# Patient Record
Sex: Male | Born: 1935 | Race: White | Hispanic: No | Smoking: Former smoker
Health system: Southern US, Community
[De-identification: ages and names within clinical notes are randomized; demographics above are authoritative.]

## PROBLEM LIST (undated history)

## (undated) DIAGNOSIS — I10 Essential (primary) hypertension: Secondary | ICD-10-CM

## (undated) DIAGNOSIS — M1990 Unspecified osteoarthritis, unspecified site: Secondary | ICD-10-CM

## (undated) HISTORY — PX: HIP SURGERY: SHX245

---

## 2001-11-02 ENCOUNTER — Encounter: Admission: RE | Admit: 2001-11-02 | Discharge: 2001-11-02 | Payer: Self-pay | Admitting: *Deleted

## 2001-11-02 ENCOUNTER — Encounter: Payer: Self-pay | Admitting: *Deleted

## 2006-03-25 ENCOUNTER — Ambulatory Visit: Admission: RE | Admit: 2006-03-25 | Discharge: 2006-04-06 | Payer: Self-pay | Admitting: Radiation Oncology

## 2006-04-17 ENCOUNTER — Ambulatory Visit: Admission: RE | Admit: 2006-04-17 | Discharge: 2006-07-16 | Payer: Self-pay | Admitting: Radiation Oncology

## 2006-05-26 ENCOUNTER — Ambulatory Visit (HOSPITAL_BASED_OUTPATIENT_CLINIC_OR_DEPARTMENT_OTHER): Admission: RE | Admit: 2006-05-26 | Discharge: 2006-05-26 | Payer: Self-pay | Admitting: Urology

## 2007-02-25 ENCOUNTER — Encounter: Admission: RE | Admit: 2007-02-25 | Discharge: 2007-02-25 | Payer: Self-pay | Admitting: Rheumatology

## 2007-12-29 ENCOUNTER — Encounter: Admission: RE | Admit: 2007-12-29 | Discharge: 2007-12-29 | Payer: Self-pay | Admitting: Orthopedic Surgery

## 2009-08-17 ENCOUNTER — Encounter: Admission: RE | Admit: 2009-08-17 | Discharge: 2009-08-17 | Payer: Self-pay | Admitting: Internal Medicine

## 2009-08-20 ENCOUNTER — Inpatient Hospital Stay (HOSPITAL_COMMUNITY): Admission: EM | Admit: 2009-08-20 | Discharge: 2009-08-23 | Payer: Self-pay | Admitting: Emergency Medicine

## 2009-12-25 ENCOUNTER — Encounter: Admission: RE | Admit: 2009-12-25 | Discharge: 2010-03-25 | Payer: Self-pay | Admitting: Orthopedic Surgery

## 2010-03-27 ENCOUNTER — Encounter: Admission: RE | Admit: 2010-03-27 | Discharge: 2010-05-08 | Payer: Self-pay | Admitting: Orthopedic Surgery

## 2010-10-03 LAB — URINALYSIS, ROUTINE W REFLEX MICROSCOPIC
Glucose, UA: NEGATIVE mg/dL
Hgb urine dipstick: NEGATIVE
Nitrite: NEGATIVE
Specific Gravity, Urine: 1.037 — ABNORMAL HIGH (ref 1.005–1.030)
Urobilinogen, UA: 1 mg/dL (ref 0.0–1.0)
pH: 6 (ref 5.0–8.0)

## 2010-10-03 LAB — PROTIME-INR
INR: 1.04 (ref 0.00–1.49)
Prothrombin Time: 13.5 seconds (ref 11.6–15.2)

## 2010-10-03 LAB — CBC
Hemoglobin: 15.3 g/dL (ref 13.0–17.0)
MCV: 99 fL (ref 78.0–100.0)
Platelets: 234 10*3/uL (ref 150–400)
RBC: 4.38 MIL/uL (ref 4.22–5.81)
WBC: 6.3 10*3/uL (ref 4.0–10.5)

## 2010-10-03 LAB — DIFFERENTIAL
Basophils Relative: 1 % (ref 0–1)
Eosinophils Absolute: 0.1 10*3/uL (ref 0.0–0.7)
Lymphs Abs: 1.6 10*3/uL (ref 0.7–4.0)

## 2010-10-03 LAB — BASIC METABOLIC PANEL
CO2: 25 mEq/L (ref 19–32)
Calcium: 9 mg/dL (ref 8.4–10.5)
Chloride: 107 mEq/L (ref 96–112)
Creatinine, Ser: 0.64 mg/dL (ref 0.4–1.5)
GFR calc non Af Amer: >60 mL/min (ref >60)
Potassium: 3.8 mEq/L (ref 3.5–5.1)

## 2010-10-03 LAB — URINE CULTURE

## 2010-11-30 NOTE — Op Note (Signed)
NAME:  David Key, David Key NO.:  [id number]   MEDICAL RECORD NO.:  [id number]          PATIENT TYPE:  REC   LOCATION:  [facility]                         FACILITY:  [facility]   PHYSICIAN:  Valetta Fuller, M.D.  DATE OF BIRTH:  21-Jan-1936   DATE OF PROCEDURE:  DATE OF DISCHARGE:                               OPERATIVE REPORT   PREOPERATIVE DIAGNOSIS:  Clinical stage T1c adenocarcinoma of the  prostate.   POSTOPERATIVE DIAGNOSIS:  Clinical stage T1c adenocarcinoma of the  prostate.   PROCEDURE PERFORMED:  I-125 prostate seed implantation.   SURGEON:  Valetta Fuller, M.D.   ASSISTANT:  Dayton Scrape.   ANESTHESIA:  General.   INDICATIONS:  [name] is a 75 year old male who was recently diagnosed  with clinical stage T1c adenocarcinoma of the prostate.  Ultrasound  revealed about 40 gram prostate.  The patient was found to have  bilateral adenocarcinoma of the prostate with a Gleason score of 3 + 3 =  6 on the left and 3 + 4 = 7 on the right.  Approximately 10% of the  tissue was involved bilaterally.  The patient's pretreatment PSA was  around 5.2.  The patient underwent extensive consultation with regard to  treatment options and also saw Dr. Ballard Russell for second opinion.  He  elected to proceed with the radiation approach and elected I-125 seed  implantation is a monotherapy.  He appeared to understand the  advantages, disadvantages, potential complications of that.  He presents  now for the procedure.   TECHNIQUE AND FINDINGS:  The patient was brought to the operating room  where he had successful induction of general anesthesia.  He was placed  in the mid lithotomy position and prepped and draped in the usual  manner.  Transrectal ultrasound probe was inserted and anchored to a  stand.  Anchoring needles were placed.  Foley catheter was also inserted  sterilely with contrast in the balloon.  The radiation oncology  department then real-time contouring of the prostate,  urethra and  rectum.  Dosing plan was established and then tweaked.  Total dose  delivered was 145 Gray.  This was done with a Nucletron seed implanting  device.  All needle passages were done with real-time ultrasound in the  sagittal plane.  A total of 27 needles were passed.  The total planned  number of seeds was 59.  This all occurred without difficulty.  At the  end of the procedure, the distribution of seeds looked excellent.  Flexible cystoscopy, however, did show two lose seeds within the  bladder.  One was removed and the other one was unable  to be removed, and we felt that the patient would be able to void that  out without difficulty.  A new Foley catheter was placed..  The patient  was brought to recovery room in stable condition, had no obvious  complications or problems.           ______________________________  Valetta Fuller, M.D.  Electronically Signed     DSG/MEDQ  D:  05/26/2006  T:  05/26/2006  Job:  [id number]   cc:   Demetria Pore. Coral Spikes, M.D.  Fax: [phone]

## 2011-02-15 ENCOUNTER — Emergency Department (HOSPITAL_BASED_OUTPATIENT_CLINIC_OR_DEPARTMENT_OTHER): Payer: Medicare Other

## 2011-02-15 ENCOUNTER — Emergency Department (INDEPENDENT_AMBULATORY_CARE_PROVIDER_SITE_OTHER): Payer: Medicare Other

## 2011-02-15 ENCOUNTER — Emergency Department (HOSPITAL_BASED_OUTPATIENT_CLINIC_OR_DEPARTMENT_OTHER)
Admission: EM | Admit: 2011-02-15 | Discharge: 2011-02-15 | Disposition: A | Discharge status: Home / Self Care | Payer: Medicare Other | Attending: Emergency Medicine | Admitting: Emergency Medicine

## 2011-02-15 ENCOUNTER — Emergency Department (HOSPITAL_COMMUNITY): Payer: Medicare Other

## 2011-02-15 ENCOUNTER — Encounter: Payer: Self-pay | Admitting: *Deleted

## 2011-02-15 DIAGNOSIS — S2239XA Fracture of one rib, unspecified side, initial encounter for closed fracture: Secondary | ICD-10-CM | POA: Insufficient documentation

## 2011-02-15 DIAGNOSIS — R079 Chest pain, unspecified: Secondary | ICD-10-CM

## 2011-02-15 DIAGNOSIS — S2249XA Multiple fractures of ribs, unspecified side, initial encounter for closed fracture: Secondary | ICD-10-CM

## 2011-02-15 DIAGNOSIS — W010XXA Fall on same level from slipping, tripping and stumbling without subsequent striking against object, initial encounter: Secondary | ICD-10-CM | POA: Insufficient documentation

## 2011-02-15 DIAGNOSIS — W19XXXA Unspecified fall, initial encounter: Secondary | ICD-10-CM

## 2011-02-15 DIAGNOSIS — I10 Essential (primary) hypertension: Secondary | ICD-10-CM | POA: Insufficient documentation

## 2011-02-15 DIAGNOSIS — K8020 Calculus of gallbladder without cholecystitis without obstruction: Secondary | ICD-10-CM

## 2011-02-15 DIAGNOSIS — M549 Dorsalgia, unspecified: Secondary | ICD-10-CM | POA: Insufficient documentation

## 2011-02-15 DIAGNOSIS — Z8739 Personal history of other diseases of the musculoskeletal system and connective tissue: Secondary | ICD-10-CM | POA: Insufficient documentation

## 2011-02-15 DIAGNOSIS — Y9210 Unspecified residential institution as the place of occurrence of the external cause: Secondary | ICD-10-CM | POA: Insufficient documentation

## 2011-02-15 DIAGNOSIS — R109 Unspecified abdominal pain: Secondary | ICD-10-CM | POA: Insufficient documentation

## 2011-02-15 HISTORY — DX: Essential (primary) hypertension: I10

## 2011-02-15 HISTORY — DX: Unspecified osteoarthritis, unspecified site: M19.90

## 2011-02-15 LAB — DIFFERENTIAL
Basophils Absolute: 0 10*3/uL (ref 0.0–0.1)
Basophils Relative: 0 % (ref 0–1)
Eosinophils Absolute: 0 10*3/uL (ref 0.0–0.7)
Eosinophils Relative: 0 % (ref 0–5)
Lymphocytes Relative: 18 % (ref 12–46)
Monocytes Absolute: 1.1 10*3/uL — ABNORMAL HIGH (ref 0.1–1.0)

## 2011-02-15 LAB — BASIC METABOLIC PANEL
CO2: 26 mEq/L (ref 19–32)
Chloride: 95 mEq/L — ABNORMAL LOW (ref 96–112)
GFR calc Af Amer: >60 mL/min (ref >60)
GFR calc non Af Amer: >60 mL/min (ref >60)

## 2011-02-15 LAB — CBC
HCT: 41.6 % (ref 39.0–52.0)
Hemoglobin: 14.4 g/dL (ref 13.0–17.0)
MCHC: 34.6 g/dL (ref 30.0–36.0)
Platelets: 178 10*3/uL (ref 150–400)

## 2011-02-15 MED ORDER — SODIUM CHLORIDE 0.9 % IV SOLN
Freq: Once | INTRAVENOUS | Status: AC
  Administered 2011-02-15: 16:00:00 via INTRAVENOUS

## 2011-02-15 MED ORDER — OXYCODONE-ACETAMINOPHEN 5-325 MG PO TABS: 2.0000 | ORAL_TABLET | ORAL | Status: AC | PRN

## 2011-02-15 MED ORDER — IOHEXOL 300 MG/ML  SOLN
100.0000 mL | Freq: Once | INTRAMUSCULAR | Status: AC | PRN
  Administered 2011-02-15: 100 mL via INTRAVENOUS

## 2011-02-15 NOTE — ED Notes (Signed)
Pt. Is in no distress.  He is reporting pain in the L back rib area after a fall on Wed.  NO other complaints by pt.  Pt. Reports to RN he lives alone with no needs and uses his walker due to a broken R leg in the past 2 yrs.

## 2011-02-15 NOTE — ED Notes (Signed)
Pt. Being trained on  Incentive spirometer

## 2011-02-15 NOTE — ED Notes (Signed)
Tripped and fell a few days ago. Inj to his left ribs. Today pain goes into his back and causes him to have a hard time getting a deep breath.

## 2011-02-15 NOTE — ED Provider Notes (Signed)
History     CSN: [id number] Arrival date & time: 02/15/2011  2:27 PM  Chief Complaint  Patient presents with  . Fall   Patient is a 75 y.o. male presenting with fall. The history is provided by the patient.  Fall The accident occurred more than 2 days ago. The fall occurred while walking. He fell from a height of 1 to 2 ft. He landed on carpet. The pain is at a severity of 4/10. The pain is moderate. He was not ambulatory at the scene. There was no drug use involved in the accident. Associated symptoms include abdominal pain. Pertinent negatives include no visual change. The symptoms are aggravated by activity and ambulation. He has tried nothing for the symptoms.  Pt reports he slipped and fell hitting his left ribs and his chest.  Pt reports pain in left abdomen and back.   Past Medical History  Diagnosis Date  . Hypertension   . Arthritis     Past Surgical History  Procedure Date  . Hip surgery     No family history on file.  History  Substance Use Topics  . Smoking status: Former Games developer  . Smokeless tobacco: Not on file  . Alcohol Use: Yes     daily      Review of Systems  Gastrointestinal: Positive for abdominal pain.  Musculoskeletal: Positive for myalgias and back pain.  All other systems reviewed and are negative.    Physical Exam  BP 102/70  Pulse 124  Temp(Src) 99 F (37.2 C) (Oral)  Resp 24  SpO2 100%  Physical Exam  Constitutional: He appears well-developed and well-nourished.  HENT:  Head: Normocephalic.  Eyes: Pupils are equal, round, and reactive to light.  Neck: Normal range of motion. Neck supple.  Cardiovascular: Normal rate.   Pulmonary/Chest: Effort normal.  Abdominal: Soft. There is tenderness. There is guarding.  Musculoskeletal: Normal range of motion.  Neurological: He is alert. No cranial nerve deficit. Coordination normal.  Skin: Skin is warm and dry.    ED Course  Procedures  MDM  patient is very tender in the left upper  abdomen and the left posterior thoracic area. Dr. Dierdre Highman was in to see and examine the patient. CT scan of chest and abdomen was obtained which shows left sixth, seventh and eighth rib fracture patient is counseled on results prescription for pain medicine patient advised to see his physician for recheck on Monday to return to the emergency department if any fever cough or difficulty breathing. Results for orders placed during the hospital encounter of 02/15/11  CBC      Component Value Range   WBC 7.4  4.0 - 10.5 (K/uL)   RBC 4.36  4.22 - 5.81 (MIL/uL)   Hemoglobin 14.4  13.0 - 17.0 (g/dL)   HCT 78.2  95.6 - 21.3 (%)   MCV 95.4  78.0 - 100.0 (fL)   MCH 33.0  26.0 - 34.0 (pg)   MCHC 34.6  30.0 - 36.0 (g/dL)   RDW 08.6  57.8 - 46.9 (%)   Platelets 178  150 - 400 (K/uL)  DIFFERENTIAL      Component Value Range   Neutrophils Relative 68  43 - 77 (%)   Neutro Abs 5.0  1.7 - 7.7 (K/uL)   Lymphocytes Relative 18  12 - 46 (%)   Lymphs Abs 1.3  0.7 - 4.0 (K/uL)   Monocytes Relative 14 (*) 3 - 12 (%)   Monocytes Absolute 1.1 (*) 0.1 - 1.0 (  K/uL)   Eosinophils Relative 0  0 - 5 (%)   Eosinophils Absolute 0.0  0.0 - 0.7 (K/uL)   Basophils Relative 0  0 - 1 (%)   Basophils Absolute 0.0  0.0 - 0.1 (K/uL)  BASIC METABOLIC PANEL      Component Value Range   Sodium 134 (*) 135 - 145 (mEq/L)   Potassium 4.7  3.5 - 5.1 (mEq/L)   Chloride 95 (*) 96 - 112 (mEq/L)   CO2 26  19 - 32 (mEq/L)   Glucose, Bld 102 (*) 70 - 99 (mg/dL)   BUN 13  6 - 23 (mg/dL)   Creatinine, Ser 1.61  0.50 - 1.35 (mg/dL)   Calcium 9.8  8.4 - 09.6 (mg/dL)   GFR calc non Af Amer >60  >60 (mL/min)   GFR calc Af Amer >60  >60 (mL/min)   Dg Chest 2 View  02/15/2011  *RADIOLOGY REPORT*  Clinical Data: Pain radiating to the left back area after fall on Wednesday  CHEST - 2 VIEW  Comparison: 08/20/2009; AP pelvis - 08/20/2009  Findings: Unchanged cardiac silhouette and mediastinal contours. No focal parenchymal opacities.  Persistent  blunting of the left costophrenic angle.  No definite pleural effusion or pneumothorax. No displaced rib fractures.  There apparent flowing syndesmophytes of the visualized thoracic spine worrisome for ankylosing spondylitis.  IMPRESSION: 1. No acute cardiopulmonary disease.  Specifically, no displaced rib fractures, definite pleural effusion or pneumothorax.  2.  Radiographic findings worrisome for ankylosing spondylitis.  If patient has any back pain, a CT scan of the area of interest is recommended.  Above findings discussed with Langston Masker, PA, at [phone]  Original Report Authenticated By: Waynard Reeds, M.D.   Ct Chest W Contrast  02/15/2011  *RADIOLOGY REPORT*  Clinical Data:  Fall, left back and rib pain.  CT CHEST, ABDOMEN AND PELVIS WITH CONTRAST  Technique:  Multidetector CT imaging of the chest, abdomen and pelvis was performed following the standard protocol during bolus administration of intravenous contrast.  Contrast: 100 ml of Omnipaque-300  Comparison:  None.  CT CHEST  Findings:  Atelectasis is seen at the lung bases.  No pleural effusion or pneumothorax is present.  The thyroid is normal.  The heart is normal in size without pericardial effusion.  The great vessels are intact.  Atherosclerotic calcifications are seen in the coronary arteries.  No mediastinal adenopathy is present. Displaced left 6th or 8th rib fractures are seen anterolaterally.  No other fractures are seen.  Flowing osteophytes are seen in the spine.  IMPRESSION: Minimally displaced left anterolateral sixth through eighth rib fractures. No pneumothorax or pleural effusion.  CT ABDOMEN AND PELVIS  Findings:  Diffuse hepatic steatosis is identified.  Gallstones are identified in the gallbladder lumen.  The adrenal glands, spleen and pancreas are normal.  The kidneys enhance symmetrically.  No focal mass lesion is seen.  No small bowel obstruction is identified. A moderate volume of stool seen throughout the colon. There is  colonic diverticulosis without acute diverticulitis. Evaluation of the pelvis is limited secondary to streak artifact from bilateral hip replacements. There is ectasia of the infrarenal abdominal aorta measuring a maximum of 2.7 cm.  No lymphadenopathy is present in the abdomen.  There is no definite free fluid seen. Radiation seeds are seen in the prostate.  Focal area of sclerosis is seen in the adjacent the right acetabulum, of uncertain etiology although, this is unchanged compared to plain film dated 08/20/2009.  Bilateral  total hip replacements are seen.  There are flowing osteophytes noted throughout the spine.  IMPRESSION:  1.  No acute findings in the abdomen or pelvis. 2.  Cholelithiasis without acute cholecystitis. 3.  Limited evaluation in the pelvis secondary to bilateral total hip prostheses. Unchanged nonspecific area of sclerosis adjacent to the right acetabulum detailed above.  Original Report Authenticated By: Hiram Gash, M.D.   Ct Abdomen Pelvis W Contrast  02/15/2011  *RADIOLOGY REPORT*  Clinical Data:  Fall, left back and rib pain.  CT CHEST, ABDOMEN AND PELVIS WITH CONTRAST  Technique:  Multidetector CT imaging of the chest, abdomen and pelvis was performed following the standard protocol during bolus administration of intravenous contrast.  Contrast: 100 ml of Omnipaque-300  Comparison:  None.  CT CHEST  Findings:  Atelectasis is seen at the lung bases.  No pleural effusion or pneumothorax is present.  The thyroid is normal.  The heart is normal in size without pericardial effusion.  The great vessels are intact.  Atherosclerotic calcifications are seen in the coronary arteries.  No mediastinal adenopathy is present. Displaced left 6th or 8th rib fractures are seen anterolaterally.  No other fractures are seen.  Flowing osteophytes are seen in the spine.  IMPRESSION: Minimally displaced left anterolateral sixth through eighth rib fractures. No pneumothorax or pleural effusion.  CT  ABDOMEN AND PELVIS  Findings:  Diffuse hepatic steatosis is identified.  Gallstones are identified in the gallbladder lumen.  The adrenal glands, spleen and pancreas are normal.  The kidneys enhance symmetrically.  No focal mass lesion is seen.  No small bowel obstruction is identified. A moderate volume of stool seen throughout the colon. There is colonic diverticulosis without acute diverticulitis. Evaluation of the pelvis is limited secondary to streak artifact from bilateral hip replacements. There is ectasia of the infrarenal abdominal aorta measuring a maximum of 2.7 cm.  No lymphadenopathy is present in the abdomen.  There is no definite free fluid seen. Radiation seeds are seen in the prostate.  Focal area of sclerosis is seen in the adjacent the right acetabulum, of uncertain etiology although, this is unchanged compared to plain film dated 08/20/2009.  Bilateral total hip replacements are seen.  There are flowing osteophytes noted throughout the spine.  IMPRESSION:  1.  No acute findings in the abdomen or pelvis. 2.  Cholelithiasis without acute cholecystitis. 3.  Limited evaluation in the pelvis secondary to bilateral total hip prostheses. Unchanged nonspecific area of sclerosis adjacent to the right acetabulum detailed above.  Original Report Authenticated By: Hiram Gash, M.D.        Langston Masker, [place] 02/15/11 1807  Medical screening examination/treatment/procedure(s) were conducted as a shared visit with non-physician practitioner(s) and myself.  I personally evaluated the patient during the encounter   Sunnie Nielsen, MD 02/16/11 [phone]

## 2011-08-20 DIAGNOSIS — C61 Malignant neoplasm of prostate: Secondary | ICD-10-CM | POA: Diagnosis not present

## 2011-08-27 DIAGNOSIS — C61 Malignant neoplasm of prostate: Secondary | ICD-10-CM | POA: Diagnosis not present

## 2011-09-04 DIAGNOSIS — I10 Essential (primary) hypertension: Secondary | ICD-10-CM | POA: Diagnosis not present

## 2011-09-04 DIAGNOSIS — M459 Ankylosing spondylitis of unspecified sites in spine: Secondary | ICD-10-CM | POA: Diagnosis not present

## 2011-09-04 DIAGNOSIS — M1990 Unspecified osteoarthritis, unspecified site: Secondary | ICD-10-CM | POA: Diagnosis not present

## 2011-09-04 DIAGNOSIS — E785 Hyperlipidemia, unspecified: Secondary | ICD-10-CM | POA: Diagnosis not present

## 2011-09-04 DIAGNOSIS — C61 Malignant neoplasm of prostate: Secondary | ICD-10-CM | POA: Diagnosis not present

## 2011-09-04 DIAGNOSIS — D126 Benign neoplasm of colon, unspecified: Secondary | ICD-10-CM | POA: Diagnosis not present

## 2011-09-04 DIAGNOSIS — R7309 Other abnormal glucose: Secondary | ICD-10-CM | POA: Diagnosis not present

## 2011-09-04 DIAGNOSIS — Z1331 Encounter for screening for depression: Secondary | ICD-10-CM | POA: Diagnosis not present

## 2011-10-21 DIAGNOSIS — D129 Benign neoplasm of anus and anal canal: Secondary | ICD-10-CM | POA: Diagnosis not present

## 2011-10-21 DIAGNOSIS — D128 Benign neoplasm of rectum: Secondary | ICD-10-CM | POA: Diagnosis not present

## 2011-10-21 DIAGNOSIS — D126 Benign neoplasm of colon, unspecified: Secondary | ICD-10-CM | POA: Diagnosis not present

## 2011-10-21 DIAGNOSIS — Z09 Encounter for follow-up examination after completed treatment for conditions other than malignant neoplasm: Secondary | ICD-10-CM | POA: Diagnosis not present

## 2011-10-21 DIAGNOSIS — K5730 Diverticulosis of large intestine without perforation or abscess without bleeding: Secondary | ICD-10-CM | POA: Diagnosis not present

## 2011-10-21 DIAGNOSIS — Z86010 Personal history of colonic polyps: Secondary | ICD-10-CM | POA: Diagnosis not present

## 2011-10-30 DIAGNOSIS — H25019 Cortical age-related cataract, unspecified eye: Secondary | ICD-10-CM | POA: Diagnosis not present

## 2011-10-30 DIAGNOSIS — H52209 Unspecified astigmatism, unspecified eye: Secondary | ICD-10-CM | POA: Diagnosis not present

## 2011-10-30 DIAGNOSIS — H353190 Nonexudative age-related macular degeneration, unspecified eye, stage unspecified: Secondary | ICD-10-CM | POA: Diagnosis not present

## 2011-10-30 DIAGNOSIS — H524 Presbyopia: Secondary | ICD-10-CM | POA: Diagnosis not present

## 2011-10-30 DIAGNOSIS — H5200 Hypermetropia, unspecified eye: Secondary | ICD-10-CM | POA: Diagnosis not present

## 2011-10-30 DIAGNOSIS — H2510 Age-related nuclear cataract, unspecified eye: Secondary | ICD-10-CM | POA: Diagnosis not present

## 2011-11-13 DIAGNOSIS — L259 Unspecified contact dermatitis, unspecified cause: Secondary | ICD-10-CM | POA: Diagnosis not present

## 2011-12-19 DIAGNOSIS — H353190 Nonexudative age-related macular degeneration, unspecified eye, stage unspecified: Secondary | ICD-10-CM | POA: Diagnosis not present

## 2011-12-19 DIAGNOSIS — H25019 Cortical age-related cataract, unspecified eye: Secondary | ICD-10-CM | POA: Diagnosis not present

## 2011-12-19 DIAGNOSIS — H2510 Age-related nuclear cataract, unspecified eye: Secondary | ICD-10-CM | POA: Diagnosis not present

## 2011-12-19 DIAGNOSIS — H3530 Unspecified macular degeneration: Secondary | ICD-10-CM | POA: Diagnosis not present

## 2012-03-03 DIAGNOSIS — M459 Ankylosing spondylitis of unspecified sites in spine: Secondary | ICD-10-CM | POA: Diagnosis not present

## 2012-03-03 DIAGNOSIS — E669 Obesity, unspecified: Secondary | ICD-10-CM | POA: Diagnosis not present

## 2012-03-03 DIAGNOSIS — B372 Candidiasis of skin and nail: Secondary | ICD-10-CM | POA: Diagnosis not present

## 2012-03-03 DIAGNOSIS — I10 Essential (primary) hypertension: Secondary | ICD-10-CM | POA: Diagnosis not present

## 2012-04-11 DIAGNOSIS — Z23 Encounter for immunization: Secondary | ICD-10-CM | POA: Diagnosis not present

## 2012-04-22 DIAGNOSIS — I8310 Varicose veins of unspecified lower extremity with inflammation: Secondary | ICD-10-CM | POA: Diagnosis not present

## 2012-04-22 DIAGNOSIS — L820 Inflamed seborrheic keratosis: Secondary | ICD-10-CM | POA: Diagnosis not present

## 2012-06-25 DIAGNOSIS — H3530 Unspecified macular degeneration: Secondary | ICD-10-CM | POA: Diagnosis not present

## 2012-06-25 DIAGNOSIS — H2510 Age-related nuclear cataract, unspecified eye: Secondary | ICD-10-CM | POA: Diagnosis not present

## 2012-06-25 DIAGNOSIS — H353190 Nonexudative age-related macular degeneration, unspecified eye, stage unspecified: Secondary | ICD-10-CM | POA: Diagnosis not present

## 2012-06-25 DIAGNOSIS — H25019 Cortical age-related cataract, unspecified eye: Secondary | ICD-10-CM | POA: Diagnosis not present

## 2012-08-25 DIAGNOSIS — I872 Venous insufficiency (chronic) (peripheral): Secondary | ICD-10-CM | POA: Diagnosis not present

## 2012-08-25 DIAGNOSIS — I8310 Varicose veins of unspecified lower extremity with inflammation: Secondary | ICD-10-CM | POA: Diagnosis not present

## 2012-08-25 DIAGNOSIS — B079 Viral wart, unspecified: Secondary | ICD-10-CM | POA: Diagnosis not present

## 2012-09-03 DIAGNOSIS — I10 Essential (primary) hypertension: Secondary | ICD-10-CM | POA: Diagnosis not present

## 2012-09-03 DIAGNOSIS — R609 Edema, unspecified: Secondary | ICD-10-CM | POA: Diagnosis not present

## 2012-09-03 DIAGNOSIS — Z1331 Encounter for screening for depression: Secondary | ICD-10-CM | POA: Diagnosis not present

## 2012-09-03 DIAGNOSIS — M459 Ankylosing spondylitis of unspecified sites in spine: Secondary | ICD-10-CM | POA: Diagnosis not present

## 2012-09-03 DIAGNOSIS — Z0000 Encounter for general adult medical examination without abnormal findings: Secondary | ICD-10-CM | POA: Diagnosis not present

## 2012-09-03 DIAGNOSIS — C61 Malignant neoplasm of prostate: Secondary | ICD-10-CM | POA: Diagnosis not present

## 2012-09-03 DIAGNOSIS — R7309 Other abnormal glucose: Secondary | ICD-10-CM | POA: Diagnosis not present

## 2012-09-03 DIAGNOSIS — E782 Mixed hyperlipidemia: Secondary | ICD-10-CM | POA: Diagnosis not present

## 2012-11-04 DIAGNOSIS — L259 Unspecified contact dermatitis, unspecified cause: Secondary | ICD-10-CM | POA: Diagnosis not present

## 2012-11-04 DIAGNOSIS — I8310 Varicose veins of unspecified lower extremity with inflammation: Secondary | ICD-10-CM | POA: Diagnosis not present

## 2012-11-05 DIAGNOSIS — C61 Malignant neoplasm of prostate: Secondary | ICD-10-CM | POA: Diagnosis not present

## 2012-11-05 DIAGNOSIS — N529 Male erectile dysfunction, unspecified: Secondary | ICD-10-CM | POA: Diagnosis not present

## 2012-12-24 DIAGNOSIS — H353190 Nonexudative age-related macular degeneration, unspecified eye, stage unspecified: Secondary | ICD-10-CM | POA: Diagnosis not present

## 2012-12-24 DIAGNOSIS — H2510 Age-related nuclear cataract, unspecified eye: Secondary | ICD-10-CM | POA: Diagnosis not present

## 2012-12-24 DIAGNOSIS — H25019 Cortical age-related cataract, unspecified eye: Secondary | ICD-10-CM | POA: Diagnosis not present

## 2012-12-24 DIAGNOSIS — H3530 Unspecified macular degeneration: Secondary | ICD-10-CM | POA: Diagnosis not present

## 2012-12-30 DIAGNOSIS — L820 Inflamed seborrheic keratosis: Secondary | ICD-10-CM | POA: Diagnosis not present

## 2012-12-30 DIAGNOSIS — I8310 Varicose veins of unspecified lower extremity with inflammation: Secondary | ICD-10-CM | POA: Diagnosis not present

## 2013-03-03 DIAGNOSIS — C61 Malignant neoplasm of prostate: Secondary | ICD-10-CM | POA: Diagnosis not present

## 2013-03-03 DIAGNOSIS — K6289 Other specified diseases of anus and rectum: Secondary | ICD-10-CM | POA: Diagnosis not present

## 2013-03-03 DIAGNOSIS — I10 Essential (primary) hypertension: Secondary | ICD-10-CM | POA: Diagnosis not present

## 2013-03-12 DIAGNOSIS — I8310 Varicose veins of unspecified lower extremity with inflammation: Secondary | ICD-10-CM | POA: Diagnosis not present

## 2013-04-23 DIAGNOSIS — D692 Other nonthrombocytopenic purpura: Secondary | ICD-10-CM | POA: Diagnosis not present

## 2013-04-23 DIAGNOSIS — I8310 Varicose veins of unspecified lower extremity with inflammation: Secondary | ICD-10-CM | POA: Diagnosis not present

## 2013-05-14 DIAGNOSIS — Z23 Encounter for immunization: Secondary | ICD-10-CM | POA: Diagnosis not present

## 2013-06-24 DIAGNOSIS — H2510 Age-related nuclear cataract, unspecified eye: Secondary | ICD-10-CM | POA: Diagnosis not present

## 2013-06-24 DIAGNOSIS — H524 Presbyopia: Secondary | ICD-10-CM | POA: Diagnosis not present

## 2013-06-24 DIAGNOSIS — H25019 Cortical age-related cataract, unspecified eye: Secondary | ICD-10-CM | POA: Diagnosis not present

## 2013-06-24 DIAGNOSIS — H353190 Nonexudative age-related macular degeneration, unspecified eye, stage unspecified: Secondary | ICD-10-CM | POA: Diagnosis not present

## 2013-07-09 DIAGNOSIS — H2510 Age-related nuclear cataract, unspecified eye: Secondary | ICD-10-CM | POA: Diagnosis not present

## 2013-07-21 DIAGNOSIS — H3530 Unspecified macular degeneration: Secondary | ICD-10-CM | POA: Diagnosis not present

## 2013-07-21 DIAGNOSIS — Z96649 Presence of unspecified artificial hip joint: Secondary | ICD-10-CM | POA: Diagnosis not present

## 2013-07-21 DIAGNOSIS — H2510 Age-related nuclear cataract, unspecified eye: Secondary | ICD-10-CM | POA: Diagnosis not present

## 2013-07-21 DIAGNOSIS — Z981 Arthrodesis status: Secondary | ICD-10-CM | POA: Diagnosis not present

## 2013-07-21 DIAGNOSIS — H269 Unspecified cataract: Secondary | ICD-10-CM | POA: Diagnosis not present

## 2013-07-21 DIAGNOSIS — Z87891 Personal history of nicotine dependence: Secondary | ICD-10-CM | POA: Diagnosis not present

## 2013-07-21 DIAGNOSIS — I10 Essential (primary) hypertension: Secondary | ICD-10-CM | POA: Diagnosis not present

## 2013-07-22 DIAGNOSIS — Z48810 Encounter for surgical aftercare following surgery on the sense organs: Secondary | ICD-10-CM | POA: Diagnosis not present

## 2013-07-22 DIAGNOSIS — H353190 Nonexudative age-related macular degeneration, unspecified eye, stage unspecified: Secondary | ICD-10-CM | POA: Diagnosis not present

## 2013-07-22 DIAGNOSIS — Z9849 Cataract extraction status, unspecified eye: Secondary | ICD-10-CM | POA: Diagnosis not present

## 2013-07-29 DIAGNOSIS — H25019 Cortical age-related cataract, unspecified eye: Secondary | ICD-10-CM | POA: Diagnosis not present

## 2013-07-29 DIAGNOSIS — H2589 Other age-related cataract: Secondary | ICD-10-CM | POA: Diagnosis not present

## 2013-07-29 DIAGNOSIS — H353190 Nonexudative age-related macular degeneration, unspecified eye, stage unspecified: Secondary | ICD-10-CM | POA: Diagnosis not present

## 2013-07-29 DIAGNOSIS — H2510 Age-related nuclear cataract, unspecified eye: Secondary | ICD-10-CM | POA: Diagnosis not present

## 2013-07-29 DIAGNOSIS — Z9849 Cataract extraction status, unspecified eye: Secondary | ICD-10-CM | POA: Diagnosis not present

## 2013-08-04 DIAGNOSIS — I10 Essential (primary) hypertension: Secondary | ICD-10-CM | POA: Diagnosis not present

## 2013-08-04 DIAGNOSIS — M129 Arthropathy, unspecified: Secondary | ICD-10-CM | POA: Diagnosis not present

## 2013-08-04 DIAGNOSIS — H3530 Unspecified macular degeneration: Secondary | ICD-10-CM | POA: Diagnosis not present

## 2013-08-04 DIAGNOSIS — H2510 Age-related nuclear cataract, unspecified eye: Secondary | ICD-10-CM | POA: Diagnosis not present

## 2013-08-04 DIAGNOSIS — H353190 Nonexudative age-related macular degeneration, unspecified eye, stage unspecified: Secondary | ICD-10-CM | POA: Diagnosis not present

## 2013-08-04 DIAGNOSIS — Z96649 Presence of unspecified artificial hip joint: Secondary | ICD-10-CM | POA: Diagnosis not present

## 2013-08-04 DIAGNOSIS — Z87891 Personal history of nicotine dependence: Secondary | ICD-10-CM | POA: Diagnosis not present

## 2013-08-05 DIAGNOSIS — H353190 Nonexudative age-related macular degeneration, unspecified eye, stage unspecified: Secondary | ICD-10-CM | POA: Diagnosis not present

## 2013-08-05 DIAGNOSIS — Z9849 Cataract extraction status, unspecified eye: Secondary | ICD-10-CM | POA: Diagnosis not present

## 2013-08-05 DIAGNOSIS — Z4889 Encounter for other specified surgical aftercare: Secondary | ICD-10-CM | POA: Diagnosis not present

## 2013-08-12 DIAGNOSIS — Z48810 Encounter for surgical aftercare following surgery on the sense organs: Secondary | ICD-10-CM | POA: Diagnosis not present

## 2013-08-12 DIAGNOSIS — H353190 Nonexudative age-related macular degeneration, unspecified eye, stage unspecified: Secondary | ICD-10-CM | POA: Diagnosis not present

## 2013-08-12 DIAGNOSIS — Z9849 Cataract extraction status, unspecified eye: Secondary | ICD-10-CM | POA: Diagnosis not present

## 2013-09-16 DIAGNOSIS — H353190 Nonexudative age-related macular degeneration, unspecified eye, stage unspecified: Secondary | ICD-10-CM | POA: Diagnosis not present

## 2013-09-16 DIAGNOSIS — Z9849 Cataract extraction status, unspecified eye: Secondary | ICD-10-CM | POA: Diagnosis not present

## 2013-09-16 DIAGNOSIS — Z4889 Encounter for other specified surgical aftercare: Secondary | ICD-10-CM | POA: Diagnosis not present

## 2013-11-23 DIAGNOSIS — Z8546 Personal history of malignant neoplasm of prostate: Secondary | ICD-10-CM | POA: Diagnosis not present

## 2013-11-23 DIAGNOSIS — Z1331 Encounter for screening for depression: Secondary | ICD-10-CM | POA: Diagnosis not present

## 2013-11-23 DIAGNOSIS — I10 Essential (primary) hypertension: Secondary | ICD-10-CM | POA: Diagnosis not present

## 2013-11-23 DIAGNOSIS — R7309 Other abnormal glucose: Secondary | ICD-10-CM | POA: Diagnosis not present

## 2013-11-23 DIAGNOSIS — R21 Rash and other nonspecific skin eruption: Secondary | ICD-10-CM | POA: Diagnosis not present

## 2013-11-23 DIAGNOSIS — Z0000 Encounter for general adult medical examination without abnormal findings: Secondary | ICD-10-CM | POA: Diagnosis not present

## 2013-11-23 DIAGNOSIS — M459 Ankylosing spondylitis of unspecified sites in spine: Secondary | ICD-10-CM | POA: Diagnosis not present

## 2013-12-05 ENCOUNTER — Encounter (HOSPITAL_COMMUNITY): Payer: Self-pay | Admitting: Emergency Medicine

## 2013-12-05 ENCOUNTER — Observation Stay (HOSPITAL_COMMUNITY): Payer: Medicare Other

## 2013-12-05 ENCOUNTER — Emergency Department (HOSPITAL_COMMUNITY): Payer: Medicare Other

## 2013-12-05 ENCOUNTER — Inpatient Hospital Stay (HOSPITAL_COMMUNITY)
Admission: EM | Admit: 2013-12-05 | Discharge: 2013-12-08 | DRG: 563 | Disposition: A | Discharge status: Skilled Nursing Facility | Payer: Medicare Other | Attending: Family Medicine | Admitting: Family Medicine

## 2013-12-05 DIAGNOSIS — G5630 Lesion of radial nerve, unspecified upper limb: Secondary | ICD-10-CM | POA: Diagnosis not present

## 2013-12-05 DIAGNOSIS — W010XXA Fall on same level from slipping, tripping and stumbling without subsequent striking against object, initial encounter: Secondary | ICD-10-CM | POA: Diagnosis present

## 2013-12-05 DIAGNOSIS — I872 Venous insufficiency (chronic) (peripheral): Secondary | ICD-10-CM | POA: Diagnosis present

## 2013-12-05 DIAGNOSIS — S42302A Unspecified fracture of shaft of humerus, left arm, initial encounter for closed fracture: Secondary | ICD-10-CM | POA: Diagnosis present

## 2013-12-05 DIAGNOSIS — I10 Essential (primary) hypertension: Secondary | ICD-10-CM | POA: Diagnosis present

## 2013-12-05 DIAGNOSIS — Z87891 Personal history of nicotine dependence: Secondary | ICD-10-CM

## 2013-12-05 DIAGNOSIS — S59909A Unspecified injury of unspecified elbow, initial encounter: Secondary | ICD-10-CM | POA: Diagnosis not present

## 2013-12-05 DIAGNOSIS — Y92009 Unspecified place in unspecified non-institutional (private) residence as the place of occurrence of the external cause: Secondary | ICD-10-CM

## 2013-12-05 DIAGNOSIS — I951 Orthostatic hypotension: Secondary | ICD-10-CM | POA: Diagnosis present

## 2013-12-05 DIAGNOSIS — S6990XA Unspecified injury of unspecified wrist, hand and finger(s), initial encounter: Secondary | ICD-10-CM | POA: Diagnosis not present

## 2013-12-05 DIAGNOSIS — F1010 Alcohol abuse, uncomplicated: Secondary | ICD-10-CM | POA: Diagnosis present

## 2013-12-05 DIAGNOSIS — N390 Urinary tract infection, site not specified: Secondary | ICD-10-CM | POA: Diagnosis present

## 2013-12-05 DIAGNOSIS — Z602 Problems related to living alone: Secondary | ICD-10-CM

## 2013-12-05 DIAGNOSIS — S42309A Unspecified fracture of shaft of humerus, unspecified arm, initial encounter for closed fracture: Principal | ICD-10-CM | POA: Diagnosis present

## 2013-12-05 DIAGNOSIS — R269 Unspecified abnormalities of gait and mobility: Secondary | ICD-10-CM | POA: Diagnosis present

## 2013-12-05 DIAGNOSIS — Z880 Allergy status to penicillin: Secondary | ICD-10-CM

## 2013-12-05 DIAGNOSIS — Z79899 Other long term (current) drug therapy: Secondary | ICD-10-CM

## 2013-12-05 DIAGNOSIS — Z96649 Presence of unspecified artificial hip joint: Secondary | ICD-10-CM

## 2013-12-05 DIAGNOSIS — E871 Hypo-osmolality and hyponatremia: Secondary | ICD-10-CM | POA: Diagnosis present

## 2013-12-05 DIAGNOSIS — S46909A Unspecified injury of unspecified muscle, fascia and tendon at shoulder and upper arm level, unspecified arm, initial encounter: Secondary | ICD-10-CM | POA: Diagnosis not present

## 2013-12-05 DIAGNOSIS — R209 Unspecified disturbances of skin sensation: Secondary | ICD-10-CM | POA: Diagnosis not present

## 2013-12-05 DIAGNOSIS — R600 Localized edema: Secondary | ICD-10-CM | POA: Diagnosis present

## 2013-12-05 DIAGNOSIS — S4980XA Other specified injuries of shoulder and upper arm, unspecified arm, initial encounter: Secondary | ICD-10-CM | POA: Diagnosis not present

## 2013-12-05 DIAGNOSIS — S5420XA Injury of radial nerve at forearm level, unspecified arm, initial encounter: Secondary | ICD-10-CM

## 2013-12-05 DIAGNOSIS — T148XXA Other injury of unspecified body region, initial encounter: Secondary | ICD-10-CM | POA: Diagnosis not present

## 2013-12-05 DIAGNOSIS — S59919A Unspecified injury of unspecified forearm, initial encounter: Secondary | ICD-10-CM | POA: Diagnosis not present

## 2013-12-05 DIAGNOSIS — S42209A Unspecified fracture of upper end of unspecified humerus, initial encounter for closed fracture: Secondary | ICD-10-CM | POA: Diagnosis not present

## 2013-12-05 DIAGNOSIS — D649 Anemia, unspecified: Secondary | ICD-10-CM | POA: Diagnosis not present

## 2013-12-05 DIAGNOSIS — R55 Syncope and collapse: Secondary | ICD-10-CM | POA: Diagnosis present

## 2013-12-05 DIAGNOSIS — S298XXA Other specified injuries of thorax, initial encounter: Secondary | ICD-10-CM | POA: Diagnosis not present

## 2013-12-05 LAB — I-STAT CHEM 8, ED
BUN: 12 mg/dL (ref 6–23)
Calcium, Ion: 1.06 mmol/L — ABNORMAL LOW (ref 1.13–1.30)
Chloride: 96 mEq/L (ref 96–112)
Creatinine, Ser: 1 mg/dL (ref 0.50–1.35)
Glucose, Bld: 119 mg/dL — ABNORMAL HIGH (ref 70–99)
HCT: 40 % (ref 39.0–52.0)
HEMOGLOBIN: 13.6 g/dL (ref 13.0–17.0)
POTASSIUM: 4.8 meq/L (ref 3.7–5.3)
SODIUM: 131 meq/L — AB (ref 137–147)
TCO2: 21 mmol/L (ref 0–100)

## 2013-12-05 LAB — URINALYSIS, ROUTINE W REFLEX MICROSCOPIC
BILIRUBIN URINE: NEGATIVE
Glucose, UA: NEGATIVE mg/dL
HGB URINE DIPSTICK: NEGATIVE
Ketones, ur: 40 mg/dL — AB
Nitrite: NEGATIVE
PH: 6.5 (ref 5.0–8.0)
Protein, ur: NEGATIVE mg/dL
SPECIFIC GRAVITY, URINE: 1.021 (ref 1.005–1.030)
Urobilinogen, UA: 1 mg/dL (ref 0.0–1.0)

## 2013-12-05 LAB — CBC
HCT: 36.8 % — ABNORMAL LOW (ref 39.0–52.0)
Hemoglobin: 12.8 g/dL — ABNORMAL LOW (ref 13.0–17.0)
MCH: 32.2 pg (ref 26.0–34.0)
MCHC: 34.8 g/dL (ref 30.0–36.0)
MCV: 92.7 fL (ref 78.0–100.0)
Platelets: 172 10*3/uL (ref 150–400)
RBC: 3.97 MIL/uL — AB (ref 4.22–5.81)
RDW: 13.9 % (ref 11.5–15.5)
WBC: 9.1 10*3/uL (ref 4.0–10.5)

## 2013-12-05 LAB — HEPATIC FUNCTION PANEL
ALBUMIN: 3.5 g/dL (ref 3.5–5.2)
ALT: 28 U/L (ref 0–53)
AST: 49 U/L — AB (ref 0–37)
Alkaline Phosphatase: 74 U/L (ref 39–117)
BILIRUBIN TOTAL: 0.8 mg/dL (ref 0.3–1.2)
Bilirubin, Direct: <0.2 mg/dL (ref 0.0–0.3)
Total Protein: 6.7 g/dL (ref 6.0–8.3)

## 2013-12-05 LAB — OSMOLALITY, URINE: OSMOLALITY UR: 624 mosm/kg (ref 390–1090)

## 2013-12-05 LAB — TSH: TSH: 3.07 u[IU]/mL (ref 0.350–4.500)

## 2013-12-05 LAB — CBG MONITORING, ED: Glucose-Capillary: 119 mg/dL — ABNORMAL HIGH (ref 70–99)

## 2013-12-05 LAB — PRO B NATRIURETIC PEPTIDE: Pro B Natriuretic peptide (BNP): 56.2 pg/mL (ref 0–450)

## 2013-12-05 LAB — URINE MICROSCOPIC-ADD ON

## 2013-12-05 LAB — OSMOLALITY: Osmolality: 295 mOsm/kg (ref 275–300)

## 2013-12-05 LAB — SODIUM, URINE, RANDOM: SODIUM UR: 33 meq/L

## 2013-12-05 MED ORDER — LORAZEPAM 1 MG PO TABS: 1.0000 mg | ORAL_TABLET | Freq: Four times a day (QID) | ORAL | Status: AC | PRN

## 2013-12-05 MED ORDER — SODIUM CHLORIDE 0.9 % IV BOLUS (SEPSIS)
500.0000 mL | Freq: Once | INTRAVENOUS | Status: AC
  Administered 2013-12-05: 250 mL via INTRAVENOUS
  Administered 2013-12-05: 500 mL via INTRAVENOUS

## 2013-12-05 MED ORDER — ADULT MULTIVITAMIN W/MINERALS CH
1.0000 | ORAL_TABLET | Freq: Every day | ORAL | Status: DC
  Administered 2013-12-05 – 2013-12-08 (×4): 1 via ORAL
  Filled 2013-12-05 (×4): qty 1

## 2013-12-05 MED ORDER — MORPHINE SULFATE 4 MG/ML IJ SOLN
4.0000 mg | Freq: Once | INTRAMUSCULAR | Status: AC
  Administered 2013-12-05: 4 mg via INTRAMUSCULAR
  Filled 2013-12-05: qty 1

## 2013-12-05 MED ORDER — ONDANSETRON HCL 4 MG/2ML IJ SOLN
4.0000 mg | Freq: Three times a day (TID) | INTRAMUSCULAR | Status: AC | PRN
Start: 2013-12-05 — End: 2013-12-05

## 2013-12-05 MED ORDER — HYDROCODONE-ACETAMINOPHEN 5-325 MG PO TABS: 2.0000 | ORAL_TABLET | ORAL | Status: DC | PRN

## 2013-12-05 MED ORDER — CIPROFLOXACIN IN D5W 400 MG/200ML IV SOLN
400.0000 mg | Freq: Two times a day (BID) | INTRAVENOUS | Status: DC
  Administered 2013-12-05 – 2013-12-07 (×5): 400 mg via INTRAVENOUS
  Filled 2013-12-05 (×6): qty 200

## 2013-12-05 MED ORDER — LORAZEPAM 2 MG/ML IJ SOLN: 1.0000 mg | Freq: Four times a day (QID) | INTRAMUSCULAR | Status: AC | PRN

## 2013-12-05 MED ORDER — VITAMIN B-1 100 MG PO TABS
100.0000 mg | ORAL_TABLET | Freq: Every day | ORAL | Status: DC
  Administered 2013-12-05 – 2013-12-08 (×4): 100 mg via ORAL
  Filled 2013-12-05 (×4): qty 1

## 2013-12-05 MED ORDER — THIAMINE HCL 100 MG/ML IJ SOLN
100.0000 mg | Freq: Every day | INTRAMUSCULAR | Status: DC
  Filled 2013-12-05: qty 1

## 2013-12-05 MED ORDER — TRIAMCINOLONE ACETONIDE 0.5 % EX CREA: TOPICAL_CREAM | Freq: Two times a day (BID) | CUTANEOUS | Status: DC

## 2013-12-05 MED ORDER — TRIAMCINOLONE ACETONIDE 0.1 % EX CREA
TOPICAL_CREAM | Freq: Two times a day (BID) | CUTANEOUS | Status: DC
  Administered 2013-12-05 – 2013-12-08 (×7): via TOPICAL

## 2013-12-05 MED ORDER — SODIUM CHLORIDE 0.9 % IJ SOLN
3.0000 mL | Freq: Two times a day (BID) | INTRAMUSCULAR | Status: DC
  Administered 2013-12-06 – 2013-12-08 (×3): 3 mL via INTRAVENOUS

## 2013-12-05 MED ORDER — FOLIC ACID 1 MG PO TABS
1.0000 mg | ORAL_TABLET | Freq: Every day | ORAL | Status: DC
  Administered 2013-12-05 – 2013-12-08 (×4): 1 mg via ORAL
  Filled 2013-12-05 (×4): qty 1

## 2013-12-05 MED ORDER — ENOXAPARIN SODIUM 40 MG/0.4ML ~~LOC~~ SOLN
40.0000 mg | SUBCUTANEOUS | Status: DC
  Administered 2013-12-05 – 2013-12-08 (×4): 40 mg via SUBCUTANEOUS
  Filled 2013-12-05 (×4): qty 0.4

## 2013-12-05 MED ORDER — MORPHINE SULFATE 4 MG/ML IJ SOLN: 4.0000 mg | Freq: Once | INTRAMUSCULAR | Status: DC

## 2013-12-05 MED ORDER — MORPHINE SULFATE 2 MG/ML IJ SOLN
2.0000 mg | INTRAMUSCULAR | Status: DC | PRN
  Administered 2013-12-05 – 2013-12-07 (×14): 2 mg via INTRAVENOUS
  Filled 2013-12-05 (×14): qty 1

## 2013-12-05 NOTE — H&P (Addendum)
History and Physical    Sanjay Broadfoot NWG:[id number] DOB: [birth year] DOA: 12/05/2013  Referring physician: Dr. Sabra Heck PCP: Wenda Low, MD  Specialists: orthopedic surgery (phone)  Chief Complaint: fall  HPI: David Key is a 78 y.o. male has a past medical history significant for HTN, arthritis s/p hip replacement surgery per Dr. Maureen Ralphs in 2011, presented to the ED with a chief complaint of left arm pain after a mechanical fall. Patient denies any syncopal episodes, LOC, chest pain. He states that he tripped and that caused his fall. He denies any fevers but endorses chills and weakness last night and today, denies cough/nausea/vomiting. In the ED patient had a humerus XR showing comminuted and displaced fracture on the left. EDP discussed with orthopedic surgery on call and since plans were per EDP to discharge patient home ortho will have seen the patient on clinic in 2 days. Patient, however, unable to get up from the stretcher and as he was trying became diaphoretic and had a pre-syncopal episode almost passed out; hospitalist asked for admission.   Review of Systems:  As per HPI otherwise negative  Past Medical History  Diagnosis Date  . Hypertension   . Arthritis    Past Surgical History  Procedure Laterality Date  . Hip surgery     Social History:  reports that he has quit smoking. He does not have any smokeless tobacco history on file. He reports that he drinks alcohol. He reports that he does not use illicit drugs.  Allergies  Allergen Reactions  . Penicillins Hives    History reviewed. No pertinent family history.   Prior to Admission medications   Medication Sig Start Date End Date Taking? Authorizing Provider  amLODipine (NORVASC) 5 MG tablet Take 5 mg by mouth daily.     Yes Historical Provider, MD  benazepril (LOTENSIN) 10 MG tablet Take 10 mg by mouth daily.   Yes Historical Provider, MD  Cholecalciferol (VITAMIN D) 2000 UNITS tablet Take 2,000 Units by mouth  daily.     Yes Historical Provider, MD  Multiple Vitamins-Minerals (ICAPS) CAPS Take 1 capsule by mouth daily.     Yes Historical Provider, MD  HYDROcodone-acetaminophen (NORCO/VICODIN) 5-325 MG per tablet Take 2 tablets by mouth every 4 (four) hours as needed. 12/05/13   Johnna Acosta, MD   Physical Exam: David Key:   12/05/13 5784 12/05/13 0719 12/05/13 0823 12/05/13 0951  BP: 110/60 116/66 135/79 130/72  Pulse: 108  107 107  Temp: 98.2 F (36.8 C)   98.1 F (36.7 C)  TempSrc: Oral   Oral  Resp: 18 18  18   Height:    5\' 10"  (1.778 m)  Weight:    101.243 kg (223 lb 3.2 oz)  SpO2: 98% 95%  99%     General:  No apparent distress, obese caucasian male  Eyes: no scleral icterus  ENT: moist oropharynx  Neck: supple, no JVD  Cardiovascular: regular rate without MRG; 2+ peripheral pulses  Respiratory: CTA biL, good air movement without wheezing, rhonchi or crackled  Abdomen: soft, non tender to palpation, positive bowel sounds, no guarding, no rebound  Skin: no rashes  Musculoskeletal: 2+ LE edema  Psychiatric: normal mood and affect  Neurologic: non focal   Labs on Admission:  Basic Metabolic Panel:  Recent Labs Lab 12/05/13 0621  NA 131*  K 4.8  CL 96  GLUCOSE 119*  BUN 12  CREATININE 1.00   Liver Function Tests:  Recent Labs Lab 12/05/13 0918  AST 49*  ALT 28  ALKPHOS 74  BILITOT 0.8  PROT 6.7  ALBUMIN 3.5   No results found for this basename: LIPASE, AMYLASE,  in the last 168 hours No results found for this basename: AMMONIA,  in the last 168 hours CBC:  Recent Labs Lab 12/05/13 0610 12/05/13 0621  WBC 9.1  --   HGB 12.8* 13.6  HCT 36.8* 40.0  MCV 92.7  --   PLT 172  --    Cardiac Enzymes: No results found for this basename: CKTOTAL, CKMB, CKMBINDEX, TROPONINI,  in the last 168 hours  BNP (last 3 results)  Recent Labs  12/05/13 0918  PROBNP 56.2   CBG:  Recent Labs Lab 12/05/13 0747  GLUCAP 119*    Radiological Exams  on Admission: Dg Elbow Complete Left  12/05/2013   CLINICAL DATA:  Status post fall; left arm pain.  EXAM: LEFT ELBOW - COMPLETE 3+ VIEW  COMPARISON:  None.  FINDINGS: The known humeral diaphysis fracture is only partially characterized on this study.  There is no evidence of fracture or dislocation about the elbow joint. The visualized joint spaces are preserved. No significant joint effusion is identified. The soft tissues are unremarkable in appearance.  IMPRESSION: Known humeral diaphysis fracture is only partially characterized on this study; no evidence of fracture or dislocation at the elbow joint.   Electronically Signed   By: Garald Balding M.D.   On: 12/05/2013 06:35   Dg Chest Port 1 View  12/05/2013   CLINICAL DATA:  Lightheaded, fall, evaluate for pneumonia  EXAM: PORTABLE CHEST - 1 VIEW  COMPARISON:  Prior chest x-ray 02/15/2011  FINDINGS: Low inspiratory volumes with trace bibasilar atelectasis. Otherwise, the lungs are clear. Cardiac and mediastinal contours are within normal limits given portable frontal technique. Atherosclerotic calcification noted in the transverse aorta. No acute osseous abnormality. No effusion or pneumothorax.  IMPRESSION: Low inspiratory volumes with trace bibasilar atelectasis.  Otherwise, no acute cardiopulmonary process.   Electronically Signed   By: Jacqulynn Cadet M.D.   On: 12/05/2013 09:38   Dg Humerus Left  12/05/2013   CLINICAL DATA:  Status post fall; pain at the mid diaphysis of the left arm.  EXAM: LEFT HUMERUS - 2+ VIEW  COMPARISON:  None.  FINDINGS: There is a comminuted and displaced fracture involving the proximal to mid diaphysis of the left humerus. A displaced butterfly fragment is seen. There is varying angulation depending on positioning.  The left humeral head remains intact; the humeral head remains seated at the glenoid fossa. The left acromioclavicular joint is unremarkable in appearance. Soft tissue swelling is noted about the fracture site.  The elbow joint is incompletely assessed, but appears grossly unremarkable.  IMPRESSION: Comminuted and displaced fracture involving the proximal mid diaphysis of the left humerus, with an associated displaced butterfly fragment. Varying angulation noted, depending on positioning.   Electronically Signed   By: Garald Balding M.D.   On: 12/05/2013 06:32    EKG: Independently reviewed.  Assessment/Plan Active Problems:   Closed fracture of left humerus   Pre-syncope   Hyponatremia   Alcohol abuse   Bilateral leg edema   Chronic venous stasis dermatitis   HTN (hypertension)   Humerus fracture - since patient will be admitted, I have formally consulted orthopedic surgery to see him here, appreciate input.  Alcohol abuse - a bottle a wine daily, place on CIWA. Unsteady gait - patient without any help at home, living alone. Suspect deconditioning and #2. PT consult.  LE  edema - BNP not suggestive of heart failure, likely chronic venous insufficiency.  HTN - hold home medications Pre-syncope - obtain orthostatics UTI - pyuria, start Ciprofloxacin, pen allergic.  Elevated LFTs - minimally, due to ETOH.    Diet: heart healthy Fluids: DVT Prophylaxis:  Code Status: Full  Family Communication: d/w friend bedside Disposition Plan: inpatient  Time spent: 40  This note has been created with Surveyor, quantity. Any transcriptional errors are unintentional.   Georgann Bramble M. Cruzita Lederer, MD [facility] Pager [phone]  If 7PM-7AM, please contact night-coverage [email or web address] Password [facility] 12/05/2013, 10:53 AM

## 2013-12-05 NOTE — Discharge Instructions (Signed)
I have discussed your care with the orthopedic surgeon - Dr. Gladstone Lighter ( a colleague of Dr. Wynelle Link ) and he will see you first thing on Tuesday morning - please keep your arm in the sling and your wrist in the brace and follow up at that time - you can just go to the office after 8:30 to be seen.

## 2013-12-05 NOTE — ED Notes (Signed)
Pt unable to sit up or stand for orthostatic vital signs hospitalist made aware

## 2013-12-05 NOTE — Progress Notes (Signed)
Pt unable to sit or stand up for orthostatic vital signs. Will try after splint is placed on LUE.

## 2013-12-05 NOTE — ED Provider Notes (Addendum)
CSN: [id number]     Arrival date & time 12/05/13  0455 History   First MD Initiated Contact with Patient 12/05/13 [phone]     Chief Complaint  Patient presents with  . Fall  . Arm Injury    left arm     (Consider location/radiation/quality/duration/timing/severity/associated sxs/prior Treatment) HPI Comments: 78 year old male who presents by ambulance after having a fall at home. He states that he tripped over his pants on the floor of the bathroom falling and striking his left operation many on the floor. He had difficulty getting up off of the floor, a family member called for ambulance transport. The patient denies hitting his head, denies neck pain or back pain but doesn't worse having some feeling of numbness to his left upper extremity in all fingers of the left hand. He denies any weakness of the arm but does not want to move it because of pain. This was acute in onset, occurred approximately 3 hours ago, worse with range of motion of the arm.  Patient is a 78 y.o. male presenting with fall and arm injury. The history is provided by the patient and the EMS personnel.  Fall  Arm Injury   Past Medical History  Diagnosis Date  . Hypertension   . Arthritis    Past Surgical History  Procedure Laterality Date  . Hip surgery     History reviewed. No pertinent family history. History  Substance Use Topics  . Smoking status: Former Research scientist (life sciences)  . Smokeless tobacco: Not on file  . Alcohol Use: Yes     Comment: daily    Review of Systems  All other systems reviewed and are negative.     Allergies  Penicillins  Home Medications   Prior to Admission medications   Medication Sig Start Date End Date Taking? Authorizing Provider  amLODipine (NORVASC) 5 MG tablet Take 5 mg by mouth daily.     Yes Historical Provider, MD  benazepril (LOTENSIN) 10 MG tablet Take 10 mg by mouth daily.   Yes Historical Provider, MD  Cholecalciferol (VITAMIN D) 2000 UNITS tablet Take 2,000 Units by  mouth daily.     Yes Historical Provider, MD  Multiple Vitamins-Minerals (ICAPS) CAPS Take 1 capsule by mouth daily.     Yes Historical Provider, MD   BP 116/66  Pulse 108  Temp(Src) 98.2 F (36.8 C) (Oral)  Resp 18  SpO2 95% Physical Exam  Nursing note and vitals reviewed. Constitutional: He appears well-developed and well-nourished. No distress.  HENT:  Head: Normocephalic and atraumatic.  Mouth/Throat: Oropharynx is clear and moist. No oropharyngeal exudate.  Eyes: Conjunctivae and EOM are normal. Pupils are equal, round, and reactive to light. Right eye exhibits no discharge. Left eye exhibits no discharge. No scleral icterus.  Neck: Normal range of motion. Neck supple. No JVD present. No thyromegaly present.  Cardiovascular: Normal rate, regular rhythm, normal heart sounds and intact distal pulses.  Exam reveals no gallop and no friction rub.   No murmur heard. Pulmonary/Chest: Effort normal and breath sounds normal. No respiratory distress. He has no wheezes. He has no rales.  Abdominal: Soft. Bowel sounds are normal. He exhibits no distension and no mass. There is no tenderness.  Musculoskeletal: He exhibits tenderness (focal tenderness to palpation over the left proximal humerus, compartments are slightly tight compared to the right side, soft forearm, normal pulses at the wrist, normal sensation of the left upper extremity in its entirety.). He exhibits no edema.  Lymphadenopathy:    He  has no cervical adenopathy.  Neurological: He is alert. Coordination normal.  Normal sensation to the bilateral hands and forearms  Skin: Skin is warm and dry. No rash noted. No erythema.  Psychiatric: He has a normal mood and affect. His behavior is normal.    ED Course  Procedures (including critical care time) Labs Review Labs Reviewed  CBC - Abnormal; Notable for the following:    RBC 3.97 (*)    Hemoglobin 12.8 (*)    HCT 36.8 (*)    All other components within normal limits   I-STAT CHEM 8, ED - Abnormal; Notable for the following:    Sodium 131 (*)    Glucose, Bld 119 (*)    Calcium, Ion 1.06 (*)    All other components within normal limits  CBG MONITORING, ED - Abnormal; Notable for the following:    Glucose-Capillary 119 (*)    All other components within normal limits    Imaging Review Dg Elbow Complete Left  12/05/2013   CLINICAL DATA:  Status post fall; left arm pain.  EXAM: LEFT ELBOW - COMPLETE 3+ VIEW  COMPARISON:  None.  FINDINGS: The known humeral diaphysis fracture is only partially characterized on this study.  There is no evidence of fracture or dislocation about the elbow joint. The visualized joint spaces are preserved. No significant joint effusion is identified. The soft tissues are unremarkable in appearance.  IMPRESSION: Known humeral diaphysis fracture is only partially characterized on this study; no evidence of fracture or dislocation at the elbow joint.   Electronically Signed   By: Garald Balding M.D.   On: 12/05/2013 06:35   Dg Humerus Left  12/05/2013   CLINICAL DATA:  Status post fall; pain at the mid diaphysis of the left arm.  EXAM: LEFT HUMERUS - 2+ VIEW  COMPARISON:  None.  FINDINGS: There is a comminuted and displaced fracture involving the proximal to mid diaphysis of the left humerus. A displaced butterfly fragment is seen. There is varying angulation depending on positioning.  The left humeral head remains intact; the humeral head remains seated at the glenoid fossa. The left acromioclavicular joint is unremarkable in appearance. Soft tissue swelling is noted about the fracture site. The elbow joint is incompletely assessed, but appears grossly unremarkable.  IMPRESSION: Comminuted and displaced fracture involving the proximal mid diaphysis of the left humerus, with an associated displaced butterfly fragment. Varying angulation noted, depending on positioning.   Electronically Signed   By: Garald Balding M.D.   On: 12/05/2013 06:32      EKG Interpretation   Date/Time:  Sunday Dec 05 2013 06:29:20 EDT Ventricular Rate:  99 PR Interval:  146 QRS Duration: 80 QT Interval:  401 QTC Calculation: 515 R Axis:   56 Text Interpretation:  Sinus rhythm Abnormal R-wave progression, early  transition since last tracing no significant change Abnormal ekg Confirmed  by Sabra Heck  MD, Kandiss Ihrig ([id number]) on 12/05/2013 6:47:44 AM      MDM   Final diagnoses:  Fracture of left humerus  Radial nerve injury    The patient likely has a fracture of his left humerus, no other signs of trauma or injury, no tenderness over the clavicle or the lower extremities bilaterally. Normal heart and lung sounds. Pain medication ordered, imaging pending. The patient will be placed in a sling  0645 AM:  Xray read as fracture of the mid humerus - this is a closed injury - d/w Dr. Gladstone Lighter who states needs velcro wrist splint for  the radial nerve injury and shoulder immobilizer for the humerus and can see him in the office Tuesday morning.  I verified this treatment with Dr. Caralyn Guile on call for orthopedic hand call - he agrees.  On attempted ambulation the patient became very diaphoretic, pale and had slight slurred speech. I was called to the bedside to reevaluate the patient immediately at which time found that the patient's speech was normal, he appeared shaky and was in a significant amount of pain. Given that the patient ambulates with a walker, only able to use one arm I felt that this was a dangerous situation this and the patient back home or here he has a predilection to falls. Was discussed with the hospitalist for admission to the hospital for his arm fracture, mild hyponatremia.  D/w Dr. Cruzita Lederer who will admit  Meds given in ED:  Medications  morphine 4 MG/ML injection 4 mg (4 mg Intramuscular Given 12/05/13 0515)         Johnna Acosta, MD 12/05/13 2993  Johnna Acosta, MD 12/05/13 [phone]

## 2013-12-05 NOTE — Consult Note (Signed)
Reason for Consult:  Left humerus fracture Referring Physician:  Dr. Loma Boston David Key is an 78 y.o. male.  HPI: 77 y/o RHD male fell last night in the bathroom at home.  He had immediate pain in the left arm.  He denies any LOC.  He was seen in the ER and immobilized in a sling.  He had difficulty standing after his injury with orthostatic hypotension.  He is admitted now for eval of his BP.  He drinks alcohol daily.  He c/o aching pain in the left arm that is sharp and severe with any motion.  Milder when holding still.  He c/o weakness in the left hand.  He doesn't smoke and has no h/o diabetes.  He has a h/o multiple hip surgeries by Dr. Maureen Ralphs.  Past Medical History  Diagnosis Date  . Hypertension   . Arthritis     Past Surgical History  Procedure Laterality Date  . Hip surgery      History reviewed. No pertinent family history.  Social History:  reports that he has quit smoking. He does not have any smokeless tobacco history on file. He reports that he drinks alcohol. He reports that he does not use illicit drugs.  He lives alone with sisters-in-law that check on him.  Allergies:  Allergies  Allergen Reactions  . Penicillins Hives    Medications: I have reviewed the patient's current medications.  Results for orders placed during the hospital encounter of 12/05/13 (from the past 48 hour(s))  CBC     Status: Abnormal   Collection Time    12/05/13  6:10 AM      Result Value Ref Range   WBC 9.1  4.0 - 10.5 K/uL   RBC 3.97 (*) 4.22 - 5.81 MIL/uL   Hemoglobin 12.8 (*) 13.0 - 17.0 g/dL   HCT 36.8 (*) 39.0 - 52.0 %   MCV 92.7  78.0 - 100.0 fL   MCH 32.2  26.0 - 34.0 pg   MCHC 34.8  30.0 - 36.0 g/dL   RDW 13.9  11.5 - 15.5 %   Platelets 172  150 - 400 K/uL  I-STAT CHEM 8, ED     Status: Abnormal   Collection Time    12/05/13  6:21 AM      Result Value Ref Range   Sodium 131 (*) 137 - 147 mEq/L   Potassium 4.8  3.7 - 5.3 mEq/L   Chloride 96  96 - 112 mEq/L   BUN 12   6 - 23 mg/dL   Creatinine, Ser 1.00  0.50 - 1.35 mg/dL   Glucose, Bld 119 (*) 70 - 99 mg/dL   Calcium, Ion 1.06 (*) 1.13 - 1.30 mmol/L   TCO2 21  0 - 100 mmol/L   Hemoglobin 13.6  13.0 - 17.0 g/dL   HCT 40.0  39.0 - 52.0 %  CBG MONITORING, ED     Status: Abnormal   Collection Time    12/05/13  7:47 AM      Result Value Ref Range   Glucose-Capillary 119 (*) 70 - 99 mg/dL   Comment 1 Documented in Chart     Comment 2 Notify RN    HEPATIC FUNCTION PANEL     Status: Abnormal   Collection Time    12/05/13  9:18 AM      Result Value Ref Range   Total Protein 6.7  6.0 - 8.3 g/dL   Albumin 3.5  3.5 - 5.2 g/dL  AST 49 (*) 0 - 37 U/L   ALT 28  0 - 53 U/L   Alkaline Phosphatase 74  39 - 117 U/L   Total Bilirubin 0.8  0.3 - 1.2 mg/dL   Bilirubin, Direct <0.2  0.0 - 0.3 mg/dL   Indirect Bilirubin NOT CALCULATED  0.3 - 0.9 mg/dL  PRO B NATRIURETIC PEPTIDE     Status: None   Collection Time    12/05/13  9:18 AM      Result Value Ref Range   Pro B Natriuretic peptide (BNP) 56.2  0 - 450 pg/mL  TSH     Status: None   Collection Time    12/05/13  9:18 AM      Result Value Ref Range   TSH 3.070  0.350 - 4.500 uIU/mL   Comment: Please note change in reference range.     Performed at [facility]     Status: None   Collection Time    12/05/13  9:18 AM      Result Value Ref Range   Osmolality 295  275 - 300 mOsm/kg   Comment: Performed at [facility] MICROSCOPIC     Status: Abnormal   Collection Time    12/05/13 11:27 AM      Result Value Ref Range   Color, Urine YELLOW  YELLOW   APPearance CLEAR  CLEAR   Specific Gravity, Urine 1.021  1.005 - 1.030   pH 6.5  5.0 - 8.0   Glucose, UA NEGATIVE  NEGATIVE mg/dL   Hgb urine dipstick NEGATIVE  NEGATIVE   Bilirubin Urine NEGATIVE  NEGATIVE   Ketones, ur 40 (*) NEGATIVE mg/dL   Protein, ur NEGATIVE  NEGATIVE mg/dL   Urobilinogen, UA 1.0  0.0 - 1.0 mg/dL   Nitrite NEGATIVE   NEGATIVE   Leukocytes, UA TRACE (*) NEGATIVE  URINE MICROSCOPIC-ADD ON     Status: None   Collection Time    12/05/13 11:27 AM      Result Value Ref Range   WBC, UA 3-6  <3 WBC/hpf    Dg Elbow Complete Left  12/05/2013   CLINICAL DATA:  Status post fall; left arm pain.  EXAM: LEFT ELBOW - COMPLETE 3+ VIEW  COMPARISON:  None.  FINDINGS: The known humeral diaphysis fracture is only partially characterized on this study.  There is no evidence of fracture or dislocation about the elbow joint. The visualized joint spaces are preserved. No significant joint effusion is identified. The soft tissues are unremarkable in appearance.  IMPRESSION: Known humeral diaphysis fracture is only partially characterized on this study; no evidence of fracture or dislocation at the elbow joint.   Electronically Signed   By: Garald Balding M.D.   On: 12/05/2013 06:35   Dg Chest Port 1 View  12/05/2013   CLINICAL DATA:  Lightheaded, fall, evaluate for pneumonia  EXAM: PORTABLE CHEST - 1 VIEW  COMPARISON:  Prior chest x-ray 02/15/2011  FINDINGS: Low inspiratory volumes with trace bibasilar atelectasis. Otherwise, the lungs are clear. Cardiac and mediastinal contours are within normal limits given portable frontal technique. Atherosclerotic calcification noted in the transverse aorta. No acute osseous abnormality. No effusion or pneumothorax.  IMPRESSION: Low inspiratory volumes with trace bibasilar atelectasis.  Otherwise, no acute cardiopulmonary process.   Electronically Signed   By: Jacqulynn Cadet M.D.   On: 12/05/2013 09:38   Dg Humerus Left  12/05/2013   CLINICAL DATA:  Status post fall;  pain at the mid diaphysis of the left arm.  EXAM: LEFT HUMERUS - 2+ VIEW  COMPARISON:  None.  FINDINGS: There is a comminuted and displaced fracture involving the proximal to mid diaphysis of the left humerus. A displaced butterfly fragment is seen. There is varying angulation depending on positioning.  The left humeral head remains  intact; the humeral head remains seated at the glenoid fossa. The left acromioclavicular joint is unremarkable in appearance. Soft tissue swelling is noted about the fracture site. The elbow joint is incompletely assessed, but appears grossly unremarkable.  IMPRESSION: Comminuted and displaced fracture involving the proximal mid diaphysis of the left humerus, with an associated displaced butterfly fragment. Varying angulation noted, depending on positioning.   Electronically Signed   By: Garald Balding M.D.   On: 12/05/2013 06:32    ROS:  No recent f/c/n/v/wt loss PE:  Blood pressure 130/72, pulse 107, temperature 98.1 F (36.7 C), temperature source Oral, resp. rate 18, height 5\' 10"  (1.778 m), weight 101.243 kg (223 lb 3.2 oz), SpO2 99.00%. WN WD male in nad.  A and O x 4.  Mood and affect normal.  EOMI.  Resp unlabored.  L arm with some ecchymosis anterolaterally.  No lymphadenopathy.  No gross deformity.  2+ radial pulse.  Feels LT in the radial, median and ulnar n dist.  Subjective decreased sens to LT in the radial dist.  3/5 strength in DF of fingers and thumb.  4/5 in grip.  Assessment/Plan: L humerus fracture - this comminuted shaft fracture is in acceptable alignment.  I believe this injury can be treated successfully in closed fashion.  With the ortho tech's assistance, I've applied a long arm coaptation splint.  I'll plan to see him back in the office in approx two weeks, and we'll get him fitted for a functional brace and initiate elbow ROM.  L radial nerve palsy - I explained the nature of this injury to the patient in detail.  We'll start active and AA ROM of the left hand and wrist and observe for recovery of nerve function.  From the orthopaedic perspective he can be discharged at any time.  PT, and OT consults ordered.  Wylene Simmer 12/05/2013, 1:48 PM

## 2013-12-05 NOTE — ED Notes (Signed)
Per EMs , pt. From home with complaint of fall around 1245 this morning in his bathroom ,tripped on his pants , denied LOC, claimed of being on the floor for 3 hours lying on his left arm. Arm sling applied to affected arm by EMS, reported of swelling on the same site. Pt. Reports of numbness on the left arm.

## 2013-12-05 NOTE — Evaluation (Signed)
Physical Therapy Evaluation Patient Details Name: David Key MRN: [id number] DOB: [birth year] Today's Date: 12/05/2013   History of Present Illness  s/p fall resulting in L humerus fx; PMHx: HTN, arthritis, hip surgery  Clinical Impression  Eval limited due to pain L UE; RN gave IV meds but pt still felt he was having too much pain to move; RN also states the ortho MD to come and splint LUE today; Will attempt further mobility after splint placed and as schedule permits; ring removed from pt left hand, 5th finger (due to potential for hand swelling) and given to pt's sister-in-law with his permission; Will follow in acute setting to address deficits below;  Pt will need SNF post acute, he will not be able to manage at home alone and he used a RW to amb prior to this fall;     Follow Up Recommendations SNF    Equipment Recommendations  None recommended by PT    Recommendations for Other Services       Precautions / Restrictions Precautions Precautions: Fall Required Braces or Orthoses: Sling;Other Brace/Splint Other Brace/Splint: shoulder immobilizer and wrist splint--pt to have splint applied today per ortho MD Restrictions Weight Bearing Restrictions: Yes LUE Weight Bearing: Non weight bearing      Mobility  Bed Mobility               General bed mobility comments: pt unable to move due to pain;   Transfers                    Ambulation/Gait                Stairs            Wheelchair Mobility    Modified Rankin (Stroke Patients Only)       Balance                                             Pertinent Vitals/Pain 10/10 L UE pain, RN gave meds; placed pillow back under LUE for  Support, tactile cues to decr spasms L upper trap area; ice to LUE; pt reported he felt better;     Home Living Family/patient expects to be discharged to:: Private residence Living Arrangements: Alone   Type of Home: House Home Access:  Stairs to enter   Technical brewer of Steps: 1 Home Layout: One level Home Equipment: Environmental consultant - 2 wheels      Prior Function Level of Independence: Independent with assistive device(s)         Comments: amb with RW x 2 years     Hand Dominance   Dominant Hand: Right    Extremity/Trunk Assessment   Upper Extremity Assessment: LUE deficits/detail           Lower Extremity Assessment: Generalized weakness         Communication   Communication: No difficulties  Cognition Arousal/Alertness: Awake/alert Behavior During Therapy: WFL for tasks assessed/performed Overall Cognitive Status: Within Functional Limits for tasks assessed                      General Comments      Exercises        Assessment/Plan    PT Assessment Patient needs continued PT services  PT Diagnosis Acute pain;Generalized weakness   PT Problem List Decreased activity tolerance;Decreased balance;Decreased  mobility;Decreased knowledge of use of DME;Decreased knowledge of precautions  PT Treatment Interventions DME instruction;Gait training;Functional mobility training;Therapeutic activities;Therapeutic exercise;Patient/family education   PT Goals (Current goals can be found in the Care Plan section) Acute Rehab PT Goals Patient Stated Goal: rehab PT Goal Formulation: With patient Time For Goal Achievement: 12/12/13 Potential to Achieve Goals: Fair    Frequency Min 3X/week   Barriers to discharge        Co-evaluation               End of Session     Patient left: in bed;with call bell/phone within reach;with family/visitor present Nurse Communication: Mobility status    Functional Assessment Tool Used: clinical judgement Functional Limitation: Changing and maintaining body position Changing and Maintaining Body Position Current Status (X1062): At least 80 percent but less than 100 percent impaired, limited or restricted Changing and Maintaining Body Position  Goal Status (I9485): At least 60 percent but less than 80 percent impaired, limited or restricted    Time: 1149-1211 PT Time Calculation (min): 22 min   Charges:   PT Evaluation $Initial PT Evaluation Tier I: 1 Procedure PT Treatments $Self Care/Home Management: 8-22   PT G Codes:   Functional Assessment Tool Used: clinical judgement Functional Limitation: Changing and maintaining body position    Neil Crouch 12/05/2013, 1:27 PM

## 2013-12-05 NOTE — ED Notes (Signed)
Attempted to call report.  Floor RN will call back.

## 2013-12-05 NOTE — ED Notes (Signed)
Patient transported to X-ray 

## 2013-12-05 NOTE — Progress Notes (Signed)
Ortho tech page.Awaiting return call.  Will continue to monitor.

## 2013-12-05 NOTE — ED Notes (Signed)
MD at bedside. 

## 2013-12-05 NOTE — ED Notes (Signed)
Good left radial pulse noted.

## 2013-12-05 NOTE — ED Notes (Signed)
Upon discharging patient, pt became diaphoretic and starting to pass out. Pt was placed back in bed. MD notified. Juice and crackers were given.

## 2013-12-05 NOTE — ED Notes (Signed)
Hospitalist at bedside 

## 2013-12-05 NOTE — Progress Notes (Signed)
Pt still unable to sit or stand up for orthostatic vital signs. Will continue to monitor.

## 2013-12-05 NOTE — ED Notes (Signed)
Bed: YC14 Expected date:  Expected time:  Means of arrival:  Comments: EMS 78yo M, fall, laying on left arm x 3 hrs

## 2013-12-06 DIAGNOSIS — R55 Syncope and collapse: Secondary | ICD-10-CM | POA: Diagnosis not present

## 2013-12-06 DIAGNOSIS — S42309A Unspecified fracture of shaft of humerus, unspecified arm, initial encounter for closed fracture: Secondary | ICD-10-CM | POA: Diagnosis not present

## 2013-12-06 DIAGNOSIS — N390 Urinary tract infection, site not specified: Secondary | ICD-10-CM

## 2013-12-06 LAB — COMPREHENSIVE METABOLIC PANEL
ALT: 23 U/L (ref 0–53)
AST: 42 U/L — AB (ref 0–37)
Albumin: 3.3 g/dL — ABNORMAL LOW (ref 3.5–5.2)
Alkaline Phosphatase: 65 U/L (ref 39–117)
BUN: 9 mg/dL (ref 6–23)
CALCIUM: 8.7 mg/dL (ref 8.4–10.5)
CO2: 27 mEq/L (ref 19–32)
Chloride: 93 mEq/L — ABNORMAL LOW (ref 96–112)
Creatinine, Ser: 0.52 mg/dL (ref 0.50–1.35)
GFR calc Af Amer: >90 mL/min (ref >90)
GFR calc non Af Amer: >90 mL/min (ref >90)
Glucose, Bld: 122 mg/dL — ABNORMAL HIGH (ref 70–99)
POTASSIUM: 4 meq/L (ref 3.7–5.3)
SODIUM: 131 meq/L — AB (ref 137–147)
TOTAL PROTEIN: 6.4 g/dL (ref 6.0–8.3)
Total Bilirubin: 1.7 mg/dL — ABNORMAL HIGH (ref 0.3–1.2)

## 2013-12-06 LAB — CBC
HCT: 30.8 % — ABNORMAL LOW (ref 39.0–52.0)
Hemoglobin: 10.7 g/dL — ABNORMAL LOW (ref 13.0–17.0)
MCH: 32.2 pg (ref 26.0–34.0)
MCHC: 34.7 g/dL (ref 30.0–36.0)
MCV: 92.8 fL (ref 78.0–100.0)
Platelets: 147 10*3/uL — ABNORMAL LOW (ref 150–400)
RBC: 3.32 MIL/uL — AB (ref 4.22–5.81)
RDW: 14.2 % (ref 11.5–15.5)
WBC: 6.2 10*3/uL (ref 4.0–10.5)

## 2013-12-06 LAB — URINE CULTURE
Colony Count: NO GROWTH
Culture: NO GROWTH

## 2013-12-06 MED ORDER — SENNOSIDES-DOCUSATE SODIUM 8.6-50 MG PO TABS
1.0000 | ORAL_TABLET | Freq: Two times a day (BID) | ORAL | Status: DC
  Administered 2013-12-06 – 2013-12-08 (×5): 1 via ORAL
  Filled 2013-12-06 (×6): qty 1

## 2013-12-06 MED ORDER — BENAZEPRIL HCL 10 MG PO TABS
10.0000 mg | ORAL_TABLET | Freq: Every day | ORAL | Status: DC
  Administered 2013-12-06 – 2013-12-08 (×3): 10 mg via ORAL
  Filled 2013-12-06 (×3): qty 1

## 2013-12-06 MED ORDER — AMLODIPINE BESYLATE 5 MG PO TABS
5.0000 mg | ORAL_TABLET | Freq: Every day | ORAL | Status: DC
  Administered 2013-12-06 – 2013-12-08 (×3): 5 mg via ORAL
  Filled 2013-12-06 (×3): qty 1

## 2013-12-06 NOTE — Progress Notes (Signed)
Reviewed with  PTA Kenyon Ana, PT Pager: [phone] 12/06/2013

## 2013-12-06 NOTE — Progress Notes (Addendum)
Pt most appropriate for SNF rehab due to lack of assistance at home and with his diagnosis of humerus fx. I have left a message for RN CM of our recommendation and spoke with SW. [phone]

## 2013-12-06 NOTE — Progress Notes (Signed)
Physical Therapy Treatment Patient Details Name: David Key MRN: [id number] DOB: 1935/12/10 Today's Date: 12/06/2013    History of Present Illness s/p fall resulting in L humerus fx; PMHx: HTN, arthritis, hip surgery    PT Comments    Assisted pt OOB to amb limited distance then positioned in recliner chair with special attention to L UE.  Pt would like to pursue CIR vs SNF.  Believe pt would benefit and pt stated he will have family support.  Will update evaluating LPT.  Follow Up Recommendations  CIR (pt would like to try for CIR vs SNF)     Equipment Recommendations       Recommendations for Other Services       Precautions / Restrictions Precautions Precautions: Fall Required Braces or Orthoses: Sling;Other Brace/Splint Other Brace/Splint: shoulder immobilizer and wrist splint ?? Restrictions Weight Bearing Restrictions: Yes LUE Weight Bearing: Non weight bearing    Mobility  Bed Mobility Overal bed mobility: Needs Assistance;+2 for physical assistance Bed Mobility: Supine to Sit     Supine to sit: Max assist     General bed mobility comments: HOB elevated and increased time.  Posterior lean with difficulty forward flexing trunk to sit upright EOB.  Once upright pt was able to sit EOB x 4 min at supervision level.   Transfers Overall transfer level: Needs assistance Equipment used: None;1 person hand held assist Transfers: Stand Pivot Transfers   Stand pivot transfers: Mod assist       General transfer comment: pt was able to self rise off elevated bed and complete 1/4 turn to his R to recliner with several small steps.  Pt did require MAX assist to control decend do to inablility to use L UE.  Ambulation/Gait Ambulation/Gait assistance: +2 physical assistance;Max assist Ambulation Distance (Feet): 5 Feet Assistive device: Hemi-walker Gait Pattern/deviations: Step-to pattern;Step-through pattern;Decreased step length - left;Decreased step length -  right;Shuffle Gait velocity: decreased   General Gait Details: pt was able to amb 5 feet using R Hemi Walker.  Required increased time and 50% VC's on proper use of AD.   Stairs            Wheelchair Mobility    Modified Rankin (Stroke Patients Only)       Balance                                    Cognition                            Exercises      General Comments        Pertinent Vitals/Pain C/o 8/10 during activity Pre medicated repositioned    Home Living                      Prior Function            PT Goals (current goals can now be found in the care plan section) Progress towards PT goals: Progressing toward goals    Frequency  Min 3X/week    PT Plan      Co-evaluation             End of Session Equipment Utilized During Treatment: Gait belt Activity Tolerance: Patient limited by fatigue;Patient limited by pain Patient left: in chair;with call bell/phone within reach     Time: 7893-8101 PT Time Calculation (  min): 30 min  Charges:  $Gait Training: 8-22 mins $Therapeutic Activity: 8-22 mins                    G Codes:      Rica Koyanagi  PTA [facility]  Acute  Rehab Pager      [phone]

## 2013-12-06 NOTE — Evaluation (Signed)
Occupational Therapy Evaluation Patient Details Name: David Key MRN: [id number] DOB: [birth year] Today's Date: 12/06/2013    History of Present Illness s/p fall resulting in L humerus fx; PMHx: HTN, arthritis, hip surgery   Clinical Impression   Pt presents to OT with decreased I with ADL activity s/p fall and L humerus fx. Pt will benefit from skilled OT to increase I with ADL activity and return to PLOF    Follow Up Recommendations  SNF    Equipment Recommendations  None recommended by OT       Precautions / Restrictions Precautions Precautions: Fall Required Braces or Orthoses: Sling;Other Brace/Splint Other Brace/Splint: shoulder immobilizer  Restrictions Weight Bearing Restrictions: Yes LUE Weight Bearing: Non weight bearing      Mobility Bed Mobility         General bed mobility comments: pt was sitting in chair. RN anD OT repositioned pt in chair with total A                ADL Overall ADL's : Needs assistance/impaired                                       General ADL Comments: Educated pt in retrograde massage and ROM for L fingers.  Pt will need reinforcement for both .  OT did reposition arm so pt could reach L fingers to perform massage. Post OT session pts fingers with decreased edema. OT also place pillow under L arm for elevation to A with edema     Vision                      Extremity/Trunk Assessment Upper Extremity Assessment LUE Deficits / Details: L fingers with AROM in all planes of motion.  Edema is limiting            Communication Communication Communication: No difficulties   Cognition Arousal/Alertness: Awake/alert Behavior During Therapy: WFL for tasks assessed/performed Overall Cognitive Status: Within Functional Limits for tasks assessed                     General Comments       Exercises       Shoulder Instructions      Home Living Family/patient expects to be discharged  to:: Skilled nursing facility                                             OT Diagnosis: Generalized weakness   OT Problem List: Decreased strength;Decreased range of motion;Increased edema   OT Treatment/Interventions: Self-care/ADL training;Therapeutic exercise;Therapeutic activities;Patient/family education    OT Goals(Current goals can be found in the care plan section) Acute Rehab OT Goals Patient Stated Goal: rehab OT Goal Formulation: With patient Time For Goal Achievement: 01/20/14  OT Frequency: Min 3X/week   Barriers to D/C: Decreased caregiver support          Co-evaluation              End of Session Nurse Communication: Mobility status  Activity Tolerance: Patient tolerated treatment well Patient left: in chair;with call bell/phone within reach;with family/visitor present   Time: 1517-6160 OT Time Calculation (min): 23 min Charges:  OT General Charges $OT Visit: 1 Procedure OT Evaluation $Initial OT Evaluation Tier I: 1  Procedure OT Treatments $Self Care/Home Management : 8-22 mins G-Codes:    Betsy Pries 2013/12/22, 1:35 PM

## 2013-12-06 NOTE — Progress Notes (Signed)
[facility] PROGRESS NOTE  Karanveer Ramakrishnan WUJ:[id number] DOB: 02/09/1936 DOA: 12/23/13 PCP: Wenda Low, MD  Assessment/Plan:  1. Left humerus fracture- orthopedics has seen the patient in splint has been applied. No surgery at this time as per orthopedics.  Patient to follow up orthopedics in 2 weeks. 2. Alcohol abuse- patient drinks 2-4 glasses of wine everyday, put on CIWA protocol 3. Presyncope- patient says that usually his unsteady on his gait, likely due to deconditioning. We'll also check orthostatics every shift 4. Hypertension- will restart medications amlodipine, and benazepril. We'll continue to monitor the blood pressure in the hospital. 5. ? UTI- patient has abnormal UA, started on ciprofloxacin. We'll follow the urine culture results.  Code Status: Full code Family Communication: *No family at bedside Disposition Plan: SNF   Consultants:  Orthopedics  Procedures:  None  Antibiotics:  None  HPI/Subjective: Patient seen and examined, no new complaints. Pain is well controlled.  Objective: Filed Vitals:   12/06/13 1419  BP:   Pulse:   Temp: 98 F (36.7 C)  Resp: 18    Intake/Output Summary (Last 24 hours) at 12/06/13 1604 Last data filed at 12/06/13 1424  Gross per 24 hour  Intake   4380 ml  Output   2700 ml  Net   1680 ml   Filed Weights   23-Dec-2013 0951  Weight: 101.243 kg (223 lb 3.2 oz)    Exam:  Physical Exam: Head: Normocephalic, atraumatic.  Eyes: No signs of jaundice, EOMI Lungs: Normal respiratory effort. B/L Clear to auscultation, no crackles or wheezes.  Heart: Regular RR. S1 and S2 normal  Abdomen: BS normoactive. Soft, Nondistended, non-tender.  Extremities: Left arm in splint  *   Data Reviewed: Basic Metabolic Panel:  Recent Labs Lab 23-Dec-2013 0621 12/06/13 0341  NA 131* 131*  K 4.8 4.0  CL 96 93*  CO2  --  27  GLUCOSE 119* 122*  BUN 12 9  CREATININE 1.00 0.52  CALCIUM  --  8.7   Liver Function  Tests:  Recent Labs Lab 12-23-2013 0918 12/06/13 0341  AST 49* 42*  ALT 28 23  ALKPHOS 74 65  BILITOT 0.8 1.7*  PROT 6.7 6.4  ALBUMIN 3.5 3.3*   No results found for this basename: LIPASE, AMYLASE,  in the last 168 hours No results found for this basename: AMMONIA,  in the last 168 hours CBC:  Recent Labs Lab 12-23-13 0610 12-23-13 0621 12/06/13 0341  WBC 9.1  --  6.2  HGB 12.8* 13.6 10.7*  HCT 36.8* 40.0 30.8*  MCV 92.7  --  92.8  PLT 172  --  147*   Cardiac Enzymes: No results found for this basename: CKTOTAL, CKMB, CKMBINDEX, TROPONINI,  in the last 168 hours BNP (last 3 results)  Recent Labs  2013-12-23 0918  PROBNP 56.2   CBG:  Recent Labs Lab 12-23-13 0747  GLUCAP 119*    No results found for this or any previous visit (from the past 240 hour(s)).   Studies: Dg Elbow Complete Left  12-23-2013   CLINICAL DATA:  Status post fall; left arm pain.  EXAM: LEFT ELBOW - COMPLETE 3+ VIEW  COMPARISON:  None.  FINDINGS: The known humeral diaphysis fracture is only partially characterized on this study.  There is no evidence of fracture or dislocation about the elbow joint. The visualized joint spaces are preserved. No significant joint effusion is identified. The soft tissues are unremarkable in appearance.  IMPRESSION: Known humeral diaphysis fracture is only partially characterized on  this study; no evidence of fracture or dislocation at the elbow joint.   Electronically Signed   By: Garald Balding M.D.   On: 12/05/2013 06:35   Dg Chest Port 1 View  12/05/2013   CLINICAL DATA:  Lightheaded, fall, evaluate for pneumonia  EXAM: PORTABLE CHEST - 1 VIEW  COMPARISON:  Prior chest x-ray 02/15/2011  FINDINGS: Low inspiratory volumes with trace bibasilar atelectasis. Otherwise, the lungs are clear. Cardiac and mediastinal contours are within normal limits given portable frontal technique. Atherosclerotic calcification noted in the transverse aorta. No acute osseous abnormality. No  effusion or pneumothorax.  IMPRESSION: Low inspiratory volumes with trace bibasilar atelectasis.  Otherwise, no acute cardiopulmonary process.   Electronically Signed   By: Jacqulynn Cadet M.D.   On: 12/05/2013 09:38   Dg Humerus Left  12/05/2013   CLINICAL DATA:  Status post fall; pain at the mid diaphysis of the left arm.  EXAM: LEFT HUMERUS - 2+ VIEW  COMPARISON:  None.  FINDINGS: There is a comminuted and displaced fracture involving the proximal to mid diaphysis of the left humerus. A displaced butterfly fragment is seen. There is varying angulation depending on positioning.  The left humeral head remains intact; the humeral head remains seated at the glenoid fossa. The left acromioclavicular joint is unremarkable in appearance. Soft tissue swelling is noted about the fracture site. The elbow joint is incompletely assessed, but appears grossly unremarkable.  IMPRESSION: Comminuted and displaced fracture involving the proximal mid diaphysis of the left humerus, with an associated displaced butterfly fragment. Varying angulation noted, depending on positioning.   Electronically Signed   By: Garald Balding M.D.   On: 12/05/2013 06:32    Scheduled Meds: . amLODipine  5 mg Oral Daily  . benazepril  10 mg Oral Daily  . ciprofloxacin  400 mg Intravenous Q12H  . enoxaparin (LOVENOX) injection  40 mg Subcutaneous Q24H  . folic acid  1 mg Oral Daily  . multivitamin with minerals  1 tablet Oral Daily  . senna-docusate  1 tablet Oral BID  . sodium chloride  3 mL Intravenous Q12H  . thiamine  100 mg Oral Daily  . compounded topicals builder   Topical BID   Continuous Infusions:   Active Problems:   Closed fracture of left humerus   Pre-syncope   Hyponatremia   Alcohol abuse   Bilateral leg edema   Chronic venous stasis dermatitis   HTN (hypertension)   UTI (urinary tract infection)    Time spent: 25 minutes    [facility] Pager [phone]. If 7PM-7AM, please contact  night-coverage at [email or web address], password [facility] 12/06/2013, 4:04 PM  LOS: 1 day

## 2013-12-06 NOTE — Consult Note (Signed)
Physical Medicine and Rehabilitation Consult  Reason for Consult: Left humerus fracture with left radial nerve palsy.  Referring Physician: Dr. Darrick Meigs   HPI: David Key is a 78 y.o. male with history of HTN, OA who tripped and fell on 12/05/13 with inability to get up for three hours. Family activated EMS and and patient with complaints of left arm numbness, and swelling but no LOC.  Xray's in ED with left comminuted and displaced fracture involving the proximal mid diaphysis of the left humerus.  Patient with presyncope with diaphoresis due to orthostatic hypotension with inability to stand. He was admitted for work up. He was started on cipro for pyuria and CIWA protocol as drinks a bottle of wine daily. Dr Doran Durand consulted for input rcommended captation splint as well as ROM for left radial nerve palsy. He's to follow up in office in two weeks for functional brace to initiate elbow ROM. PT evaluation done today and CIR recommended by MD and PT.    ROS  Past Medical History  Diagnosis Date  . Hypertension   . Arthritis    Past Surgical History  Procedure Laterality Date  . Hip surgery     History reviewed. No pertinent family history.  Social History:  reports that he has quit smoking. He does not have any smokeless tobacco history on file. He reports that he drinks alcohol. He reports that he does not use illicit drugs.   Allergies  Allergen Reactions  . Penicillins Hives   Medications Prior to Admission  Medication Sig Dispense Refill  . amLODipine (NORVASC) 5 MG tablet Take 5 mg by mouth daily.        . benazepril (LOTENSIN) 10 MG tablet Take 10 mg by mouth daily.      . Cholecalciferol (VITAMIN D) 2000 UNITS tablet Take 2,000 Units by mouth daily.        . Multiple Vitamins-Minerals (ICAPS) CAPS Take 1 capsule by mouth daily.          Home: Home Living Family/patient expects to be discharged to:: Private residence Living Arrangements: Alone Type of Home:  House Home Access: Stairs to enter Technical brewer of Steps: 1 Home Layout: One level Home Equipment: Environmental consultant - 2 wheels  Functional History: Prior Function Level of Independence: Independent with assistive device(s) Comments: amb with RW x 2 years Functional Status:  Mobility: Bed Mobility Overal bed mobility: Needs Assistance;+2 for physical assistance Bed Mobility: Supine to Sit Supine to sit: Max assist General bed mobility comments: HOB elevated and increased time.  Posterior lean with difficulty forward flexing trunk to sit upright EOB.  Once upright pt was able to sit EOB x 4 min at supervision level.  Transfers Overall transfer level: Needs assistance Equipment used: None;1 person hand held assist Transfers: Stand Pivot Transfers Stand pivot transfers: Mod assist General transfer comment: pt was able to self rise off elevated bed and complete 1/4 turn to his R to recliner with several small steps.  Pt did require MAX assist to control decend do to inablility to use L UE. Ambulation/Gait Ambulation/Gait assistance: +2 physical assistance;Max assist Ambulation Distance (Feet): 5 Feet Assistive device: Hemi-walker Gait Pattern/deviations: Step-to pattern;Step-through pattern;Decreased step length - left;Decreased step length - right;Shuffle Gait velocity: decreased General Gait Details: pt was able to amb 5 feet using R Hemi Walker.  Required increased time and 50% VC's on proper use of AD.    ADL:    Cognition: Cognition Overall Cognitive Status: Within Functional  Limits for tasks assessed Cognition Arousal/Alertness: Awake/alert Behavior During Therapy: WFL for tasks assessed/performed Overall Cognitive Status: Within Functional Limits for tasks assessed  Blood pressure 134/67, pulse 96, temperature 97.9 F (36.6 C), temperature source Oral, resp. rate 18, height 5\' 10"  (1.778 m), weight 101.243 kg (223 lb 3.2 oz), SpO2 97.00%. Physical Exam  Results for  orders placed during the hospital encounter of 12/05/13 (from the past 24 hour(s))  URINALYSIS, ROUTINE W REFLEX MICROSCOPIC     Status: Abnormal   Collection Time    12/05/13 11:27 AM      Result Value Ref Range   Color, Urine YELLOW  YELLOW   APPearance CLEAR  CLEAR   Specific Gravity, Urine 1.021  1.005 - 1.030   pH 6.5  5.0 - 8.0   Glucose, UA NEGATIVE  NEGATIVE mg/dL   Hgb urine dipstick NEGATIVE  NEGATIVE   Bilirubin Urine NEGATIVE  NEGATIVE   Ketones, ur 40 (*) NEGATIVE mg/dL   Protein, ur NEGATIVE  NEGATIVE mg/dL   Urobilinogen, UA 1.0  0.0 - 1.0 mg/dL   Nitrite NEGATIVE  NEGATIVE   Leukocytes, UA TRACE (*) NEGATIVE  OSMOLALITY, URINE     Status: None   Collection Time    12/05/13 11:27 AM      Result Value Ref Range   Osmolality, Ur 624  390 - 1090 mOsm/kg  SODIUM, URINE, RANDOM     Status: None   Collection Time    12/05/13 11:27 AM      Result Value Ref Range   Sodium, Ur 33    URINE MICROSCOPIC-ADD ON     Status: None   Collection Time    12/05/13 11:27 AM      Result Value Ref Range   WBC, UA 3-6  <3 WBC/hpf  CBC     Status: Abnormal   Collection Time    12/06/13  3:41 AM      Result Value Ref Range   WBC 6.2  4.0 - 10.5 K/uL   RBC 3.32 (*) 4.22 - 5.81 MIL/uL   Hemoglobin 10.7 (*) 13.0 - 17.0 g/dL   HCT 30.8 (*) 39.0 - 52.0 %   MCV 92.8  78.0 - 100.0 fL   MCH 32.2  26.0 - 34.0 pg   MCHC 34.7  30.0 - 36.0 g/dL   RDW 14.2  11.5 - 15.5 %   Platelets 147 (*) 150 - 400 K/uL  COMPREHENSIVE METABOLIC PANEL     Status: Abnormal   Collection Time    12/06/13  3:41 AM      Result Value Ref Range   Sodium 131 (*) 137 - 147 mEq/L   Potassium 4.0  3.7 - 5.3 mEq/L   Chloride 93 (*) 96 - 112 mEq/L   CO2 27  19 - 32 mEq/L   Glucose, Bld 122 (*) 70 - 99 mg/dL   BUN 9  6 - 23 mg/dL   Creatinine, Ser 0.52  0.50 - 1.35 mg/dL   Calcium 8.7  8.4 - 10.5 mg/dL   Total Protein 6.4  6.0 - 8.3 g/dL   Albumin 3.3 (*) 3.5 - 5.2 g/dL   AST 42 (*) 0 - 37 U/L   ALT 23  0 - 53  U/L   Alkaline Phosphatase 65  39 - 117 U/L   Total Bilirubin 1.7 (*) 0.3 - 1.2 mg/dL   GFR calc non Af Amer >90  >90 mL/min   GFR calc Af Amer >90  >90 mL/min  Dg Elbow Complete Left  12/05/2013   CLINICAL DATA:  Status post fall; left arm pain.  EXAM: LEFT ELBOW - COMPLETE 3+ VIEW  COMPARISON:  None.  FINDINGS: The known humeral diaphysis fracture is only partially characterized on this study.  There is no evidence of fracture or dislocation about the elbow joint. The visualized joint spaces are preserved. No significant joint effusion is identified. The soft tissues are unremarkable in appearance.  IMPRESSION: Known humeral diaphysis fracture is only partially characterized on this study; no evidence of fracture or dislocation at the elbow joint.   Electronically Signed   By: Garald Balding M.D.   On: 12/05/2013 06:35   Dg Chest Port 1 View  12/05/2013   CLINICAL DATA:  Lightheaded, fall, evaluate for pneumonia  EXAM: PORTABLE CHEST - 1 VIEW  COMPARISON:  Prior chest x-ray 02/15/2011  FINDINGS: Low inspiratory volumes with trace bibasilar atelectasis. Otherwise, the lungs are clear. Cardiac and mediastinal contours are within normal limits given portable frontal technique. Atherosclerotic calcification noted in the transverse aorta. No acute osseous abnormality. No effusion or pneumothorax.  IMPRESSION: Low inspiratory volumes with trace bibasilar atelectasis.  Otherwise, no acute cardiopulmonary process.   Electronically Signed   By: Jacqulynn Cadet M.D.   On: 12/05/2013 09:38   Dg Humerus Left  12/05/2013   CLINICAL DATA:  Status post fall; pain at the mid diaphysis of the left arm.  EXAM: LEFT HUMERUS - 2+ VIEW  COMPARISON:  None.  FINDINGS: There is a comminuted and displaced fracture involving the proximal to mid diaphysis of the left humerus. A displaced butterfly fragment is seen. There is varying angulation depending on positioning.  The left humeral head remains intact; the humeral head  remains seated at the glenoid fossa. The left acromioclavicular joint is unremarkable in appearance. Soft tissue swelling is noted about the fracture site. The elbow joint is incompletely assessed, but appears grossly unremarkable.  IMPRESSION: Comminuted and displaced fracture involving the proximal mid diaphysis of the left humerus, with an associated displaced butterfly fragment. Varying angulation noted, depending on positioning.   Electronically Signed   By: Garald Balding M.D.   On: 12/05/2013 06:32    Assessment/Plan: Diagnosis: left humerus fx, radial nerve palsy 1. Does the need for close, 24 hr/day medical supervision in concert with the patient's rehab needs make it unreasonable for this patient to be served in a less intensive setting? No 2. Co-Morbidities requiring supervision/potential complications: see above 3. Due to bladder management, bowel management, safety, skin/wound care, disease management, medication administration and patient education, does the patient require 24 hr/day rehab nursing? No 4. Does the patient require coordinated care of a physician, rehab nurse, PT, OT to address physical and functional deficits in the context of the above medical diagnosis(es)? No Addressing deficits in the following areas: balance, endurance, locomotion, strength, transferring, bowel/bladder control, bathing, dressing, feeding and grooming 5. Can the patient actively participate in an intensive therapy program of at least 3 hrs of therapy per day at least 5 days per week? No 6. The potential for patient to make measurable gains while on inpatient rehab is fair 7. Anticipated functional outcomes upon discharge from inpatient rehab are n/a  with PT, n/a with OT, n/a with SLP. 8. Estimated rehab length of stay to reach the above functional goals is: n/a 9. Does the patient have adequate social supports to accommodate these discharge functional goals? No 10. Anticipated D/C setting:  Other 11. Anticipated post D/C treatments: HH therapy  12. Overall Rehab/Functional Prognosis: good  RECOMMENDATIONS: This patient's condition is appropriate for continued rehabilitative care in the following setting: SNF Patient has agreed to participate in recommended program. N/A Note that insurance prior authorization may be required for reimbursement for recommended care.  Comment: Lack social supports---will need assistance once home   Meredith Staggers, MD, [facility]     12/06/2013

## 2013-12-06 NOTE — Progress Notes (Signed)
Utilization review completed.  

## 2013-12-06 NOTE — Progress Notes (Signed)
Clinical Social Work Department BRIEF PSYCHOSOCIAL ASSESSMENT 12/06/2013  Patient:  David Key,David Key     Account Number:  [id number]     Admit date:  12/05/2013  Clinical Social Worker:  Renold Genta  Date/Time:  12/06/2013 11:06 AM  Referred by:  Physician  Date Referred:  12/06/2013 Referred for  SNF Placement   Other Referral:   Interview type:  Patient Other interview type:    PSYCHOSOCIAL DATA Living Status:  ALONE Admitted from facility:   Level of care:   Primary support name:  Ilsa Iha (sister-in-law) ph#: [phone] Primary support relationship to patient:  FAMILY Degree of support available:   good    CURRENT CONCERNS Current Concerns  Post-Acute Placement   Other Concerns:    SOCIAL WORK ASSESSMENT / PLAN CSW received consult from Dr. Darrick Meigs that patient will need CIR or SNF at discharge.   Assessment/plan status:  Information/Referral to [employer] Other assessment/ plan:   Information/referral to community resources:   CSW completed FL2 and faxed information out to [facility] as backup to CIR.    PATIENT'S/FAMILY'S RESPONSE TO PLAN OF CARE: Patient is hoping for [facility], CSW explained that patient will need someone to stay with him for 24 hours for about 2 weeks post discharge from [facility] - patient states that his sister-in-law will be able to do that. Patient states that he's been to [facility] back in 2011 after his hip fracture but would like to look at other facilities if CIR is not able to take him.       Raynaldo Opitz, [facility] Clinical Social Worker cell #: [phone]

## 2013-12-06 NOTE — Progress Notes (Signed)
Physical Therapy Treatment Patient Details Name: David Key MRN: [id number] DOB: Jan 06, 1936 Today's Date: 12/06/2013    History of Present Illness s/p fall resulting in L humerus fx; PMHx: HTN, arthritis, hip surgery    PT Comments    Assisted pt out of recliner back to bed with help from NT.   Follow Up Recommendations  SNF (CIR was not approved)     Equipment Recommendations       Recommendations for Other Services       Precautions / Restrictions Precautions Precautions: Fall Required Braces or Orthoses: Sling;Other Brace/Splint Other Brace/Splint: shoulder immobilizer  Restrictions Weight Bearing Restrictions: Yes LUE Weight Bearing: Non weight bearing    Mobility  Bed Mobility Overal bed mobility: Needs Assistance Bed Mobility: Sit to Supine       Sit to supine: +2 for physical assistance;Total assist   General bed mobility comments: + 2 total assist to position back to bed with increased time to position L UE comfort and finger/hand elevation  Transfers Overall transfer level: Needs assistance Equipment used: None;1 person hand held assist             General transfer comment: using a bed sheet around pt's waist, asssited out of lower level recliner to stand.  Static standing one min to achieve BP.  Assisted with lower to bed total assist to control decend.  Pt was able to support his own body weight in standing and also able to turn and complete pivot with VC's for direction.  Ambulation/Gait             General Gait Details: transfer only back to bed this second session due to fatigue.   Stairs            Wheelchair Mobility    Modified Rankin (Stroke Patients Only)       Balance                                    Cognition Arousal/Alertness: Awake/alert Behavior During Therapy: WFL for tasks assessed/performed Overall Cognitive Status: Within Functional Limits for tasks assessed                      Exercises      General Comments        Pertinent Vitals/Pain     Home Living Family/patient expects to be discharged to:: Skilled nursing facility                    Prior Function            PT Goals (current goals can now be found in the care plan section) Acute Rehab PT Goals Patient Stated Goal: rehab Progress towards PT goals: Progressing toward goals    Frequency  Min 3X/week    PT Plan      Co-evaluation             End of Session   Activity Tolerance: Patient limited by fatigue;Patient limited by pain Patient left: in bed;with call bell/phone within reach;with family/visitor present     Time: 1410-1425 PT Time Calculation (min): 15 min  Charges:  $Therapeutic Activity: 8-22 mins                    G Codes:      Rica Koyanagi  PTA [facility]  Acute  Rehab Pager      [phone]

## 2013-12-06 NOTE — Progress Notes (Signed)
Clinical Social Work Department CLINICAL SOCIAL WORK PLACEMENT NOTE 12/06/2013  Patient:  David Key,David Key  Account Number:  [id number] Admit date:  12/05/2013  Clinical Social Worker:  Renold Genta  Date/time:  12/06/2013 11:13 AM  Clinical Social Work is seeking post-discharge placement for this patient at the following level of care:   SKILLED NURSING   (*CSW will update this form in Epic as items are completed)   12/06/2013  Patient/family provided with [place] Department of Clinical Social Work's list of facilities offering this level of care within the geographic area requested by the patient (or if unable, by the patient's family).  12/06/2013  Patient/family informed of their freedom to choose among providers that offer the needed level of care, that participate in Medicare, Medicaid or managed care program needed by the patient, have an available bed and are willing to accept the patient.  12/06/2013  Patient/family informed of [facility]' ownership interest in [facility], as well as of the fact that they are under no obligation to receive care at this facility.  PASARR submitted to EDS on 12/06/2013 PASARR number received from EDS on 12/06/2013  FL2 transmitted to all facilities in geographic area requested by pt/family on  12/06/2013 FL2 transmitted to all facilities within larger geographic area on   Patient informed that his/her managed care company has contracts with or will negotiate with  certain facilities, including the following:     Patient/family informed of bed offers received:   Patient chooses bed at  Physician recommends and patient chooses bed at    Patient to be transferred to  on   Patient to be transferred to facility by   The following physician request were entered in Epic:   Additional Comments:   Raynaldo Opitz, [place] Social Worker cell #: [phone]

## 2013-12-06 NOTE — Progress Notes (Signed)
   Subjective:     recheck left humerus s/p fracture and coaptation splint application Pt c/o mild to moderate pain at times but pain has improved since yesterday Pt able to move fingers this morning which is an improvement Asking for home health upon discharge  Patient reports pain as moderate.  Objective:   VITALS:   Filed Vitals:   12/06/13 0327  BP: 134/67  Pulse: 96  Temp: 97.9 F (36.6 C)  Resp: 18    Left upper extremity in sling and coaptation splint nv intact distally  LABS  Recent Labs  12/05/13 0610 12/05/13 0621 12/06/13 0341  HGB 12.8* 13.6 10.7*  HCT 36.8* 40.0 30.8*  WBC 9.1  --  6.2  PLT 172  --  147*     Recent Labs  12/05/13 0621 12/06/13 0341  NA 131* 131*  K 4.8 4.0  BUN 12 9  CREATININE 1.00 0.52  GLUCOSE 119* 122*     Assessment/Plan:    left humeral shaft fracture in acceptable alignment for conservative management Recommend home health to aid with ADL's  F/u in 1-2 weeks and discuss sarmiento placement for left arm Pain control No lifting or use of left upper extremity Pt may be discharged whenever medically cleared and f/u as outpatient   Merla Riches, MPAS, PA-C  12/06/2013, 7:27 AM

## 2013-12-07 DIAGNOSIS — S42309A Unspecified fracture of shaft of humerus, unspecified arm, initial encounter for closed fracture: Secondary | ICD-10-CM | POA: Diagnosis not present

## 2013-12-07 DIAGNOSIS — E871 Hypo-osmolality and hyponatremia: Secondary | ICD-10-CM | POA: Diagnosis not present

## 2013-12-07 DIAGNOSIS — I10 Essential (primary) hypertension: Secondary | ICD-10-CM | POA: Diagnosis not present

## 2013-12-07 DIAGNOSIS — R55 Syncope and collapse: Secondary | ICD-10-CM | POA: Diagnosis not present

## 2013-12-07 LAB — BASIC METABOLIC PANEL
BUN: 7 mg/dL (ref 6–23)
CHLORIDE: 94 meq/L — AB (ref 96–112)
CO2: 28 meq/L (ref 19–32)
Calcium: 8.5 mg/dL (ref 8.4–10.5)
Creatinine, Ser: 0.64 mg/dL (ref 0.50–1.35)
GFR calc Af Amer: >90 mL/min (ref >90)
GFR calc non Af Amer: >90 mL/min (ref >90)
GLUCOSE: 110 mg/dL — AB (ref 70–99)
Potassium: 4 mEq/L (ref 3.7–5.3)
Sodium: 131 mEq/L — ABNORMAL LOW (ref 137–147)

## 2013-12-07 NOTE — Progress Notes (Signed)
Physical Therapy Treatment Patient Details Name: David Key MRN: [id number] DOB: [birth year] Today's Date: 12/07/2013    History of Present Illness s/p fall resulting in L humerus fx; PMHx: HTN, arthritis, hip surgery    PT Comments    Pt progressing.  Assisted OOB to amb in hallway then positioned in recliner.  Follow Up Recommendations  SNF     Equipment Recommendations       Recommendations for Other Services       Precautions / Restrictions Precautions Precautions: Fall Required Braces or Orthoses: Sling;Other Brace/Splint Other Brace/Splint: shoulder immobilizer  Restrictions Weight Bearing Restrictions: Yes LUE Weight Bearing: Non weight bearing    Mobility  Bed Mobility Overal bed mobility: Needs Assistance Bed Mobility: Supine to Sit     Supine to sit: Max assist     General bed mobility comments: + 1 total assist to transition from supine to EOB.  Severe posterior lean with difficulty flxing forward.  Pt stated "even in the recliner I tend to lay flat (extended)".   Transfers Overall transfer level: Needs assistance   Transfers: Sit to/from Stand   Stand pivot transfers: Mod assist       General transfer comment: using a flat bed sheet around pt's waist, assisted with sit to stand by pulling forward on sheet to assist upper body up and over lower body during sit to stand.    Ambulation/Gait Ambulation/Gait assistance: +2 safety/equipment;Mod assist Ambulation Distance (Feet): 24 Feet Assistive device: Hemi-walker Gait Pattern/deviations: Step-to pattern;Step-through pattern;Decreased stride length;Decreased dorsiflexion - right;Wide base of support;Trunk flexed Gait velocity: decreased   General Gait Details: amb + 2 assist for safety such that recliner chair was following close behind.  pt required increased time and 25% VC's on proper use hemiwalker and proper placement.  Poor standing posture as pt is bent over and leg stance is wide. Pt  tolerated increased distance.   Stairs            Wheelchair Mobility    Modified Rankin (Stroke Patients Only)       Balance                                    Cognition Arousal/Alertness: Awake/alert Behavior During Therapy: WFL for tasks assessed/performed Overall Cognitive Status: Within Functional Limits for tasks assessed                      Exercises Hand Exercises Digit Composite Flexion: AROM;Left;5 reps Composite Extension: 5 reps;Left;AROM Thumb Abduction: AROM;Left;5 reps Thumb Adduction: Left;AROM;5 reps Opposition: AAROM;Left    General Comments        Pertinent Vitals/Pain C/o 4/10 L UE ICE applied     Home Living                      Prior Function            PT Goals (current goals can now be found in the care plan section) Progress towards PT goals: Progressing toward goals    Frequency  Min 3X/week    PT Plan      Co-evaluation             End of Session Equipment Utilized During Treatment: Gait belt Activity Tolerance: Patient tolerated treatment well Patient left: in chair;with call bell/phone within reach;with family/visitor present     Time: 6314-9702 PT Time Calculation (min): 28 min  Charges:  $Gait Training: 8-22 mins $Therapeutic Activity: 8-22 mins                    G Codes:      Rica Koyanagi  PTA [facility]  Acute  Rehab Pager      [phone]

## 2013-12-07 NOTE — Progress Notes (Addendum)
[facility] PROGRESS NOTE  David Key QBH:[id number] DOB: 1935/10/16 DOA: 12/05/2013 PCP: Wenda Low, MD  Interval history 78 y.o. male has a past medical history significant for HTN, arthritis s/p hip replacement surgery per Dr. Maureen Ralphs in 2011, presented to the ED with a chief complaint of left arm pain after a mechanical fall. Patient denies any syncopal episodes, LOC, chest pain. He states that he tripped and that caused his fall. He denies any fevers but endorses chills and weakness last night and today, denies cough/nausea/vomiting. In the ED patient had a humerus XR showing comminuted and displaced fracture on the left. EDP discussed with orthopedic surgery on call and since plans were per EDP to discharge patient home ortho will have seen the patient on clinic in 2 days. Patient, however, unable to get up from the stretcher and as he was trying became diaphoretic and had a pre-syncopal episode almost passed out; hospitalist asked for admission   Assessment/Plan:  1. Left humerus fracture- orthopedics has seen the patient in splint has been applied. No surgery at this time as per orthopedics.  Patient to follow up orthopedics in 2 weeks. 2. Left radial nerve palsy- patient has left radial nerve palsy, and ortho recommends active ROM and PT to see if it recovers. Continue with PT. 3. Alcohol abuse- patient drinks 2-4 glasses of wine everyday, put on CIWA protocol 4. Presyncope- patient says that usually his unsteady on his gait, likely due to deconditioning. Orthostatic vital signs q shift. 5. Hypertension- will restart medications amlodipine, and benazepril. We'll continue to monitor the blood pressure in the hospital. 6. ? UTI- patient has abnormal UA, started on ciprofloxacin. Urine culture showed no growth. Will discontinue the cipro. 7. Hyponatremia- Sodium is 131, will put on fluid restriction. Check serum osmolality. 8. Anemia- Hb dropped from 13.6--10.7,  ? dilutional, repeat  cbc in am. 9. DVT prophylaxis- Lovenox  Code Status: Full code Family Communication: Discussed with sister in law at bedside Disposition Plan: SNF   Consultants:  Orthopedics  Procedures:  None  Antibiotics:  None  HPI/Subjective: Patient seen and examined, no new complaints. Pain is well controlled. Swelling of the fingers has improved.  Objective: Filed Vitals:   12/07/13 1327  BP: 114/58  Pulse: 70  Temp: 97.9 F (36.6 C)  Resp: 18    Intake/Output Summary (Last 24 hours) at 12/07/13 1610 Last data filed at 12/07/13 1327  Gross per 24 hour  Intake   1120 ml  Output   2810 ml  Net  -1690 ml   Filed Weights   12/05/13 0951  Weight: 101.243 kg (223 lb 3.2 oz)    Exam:  Physical Exam: Head: Normocephalic, atraumatic.  Eyes: No signs of jaundice, EOMI Lungs: Normal respiratory effort. B/L Clear to auscultation, no crackles or wheezes.  Heart: Regular RR. S1 and S2 normal  Abdomen: BS normoactive. Soft, Nondistended, non-tender.  Extremities: Left arm in splint, mild edema of the left hand  *   Data Reviewed: Basic Metabolic Panel:  Recent Labs Lab 12/05/13 0621 12/06/13 0341 12/07/13 0420  NA 131* 131* 131*  K 4.8 4.0 4.0  CL 96 93* 94*  CO2  --  27 28  GLUCOSE 119* 122* 110*  BUN 12 9 7   CREATININE 1.00 0.52 0.64  CALCIUM  --  8.7 8.5   Liver Function Tests:  Recent Labs Lab 12/05/13 0918 12/06/13 0341  AST 49* 42*  ALT 28 23  ALKPHOS 74 65  BILITOT 0.8 1.7*  PROT  6.7 6.4  ALBUMIN 3.5 3.3*   No results found for this basename: LIPASE, AMYLASE,  in the last 168 hours No results found for this basename: AMMONIA,  in the last 168 hours CBC:  Recent Labs Lab 12/05/13 0610 12/05/13 0621 12/06/13 0341  WBC 9.1  --  6.2  HGB 12.8* 13.6 10.7*  HCT 36.8* 40.0 30.8*  MCV 92.7  --  92.8  PLT 172  --  147*   Cardiac Enzymes: No results found for this basename: CKTOTAL, CKMB, CKMBINDEX, TROPONINI,  in the last 168 hours BNP  (last 3 results)  Recent Labs  12/05/13 0918  PROBNP 56.2   CBG:  Recent Labs Lab 12/05/13 0747  GLUCAP 119*    Recent Results (from the past 240 hour(s))  URINE CULTURE     Status: None   Collection Time    12/05/13  1:18 PM      Result Value Ref Range Status   Specimen Description URINE, CLEAN CATCH   Final   Special Requests NONE   Final   Culture  Setup Time     Final   Value: 12/05/2013 20:00     Performed at [place]     Final   Value: NO GROWTH     Performed at [facility]   Culture     Final   Value: NO GROWTH     Performed at [facility]   Report Status 12/06/2013 FINAL   Final     Studies: No results found.  Scheduled Meds: . amLODipine  5 mg Oral Daily  . benazepril  10 mg Oral Daily  . ciprofloxacin  400 mg Intravenous Q12H  . enoxaparin (LOVENOX) injection  40 mg Subcutaneous Q24H  . folic acid  1 mg Oral Daily  . multivitamin with minerals  1 tablet Oral Daily  . senna-docusate  1 tablet Oral BID  . sodium chloride  3 mL Intravenous Q12H  . thiamine  100 mg Oral Daily  . compounded topicals builder   Topical BID   Continuous Infusions:   Active Problems:   Closed fracture of left humerus   Pre-syncope   Hyponatremia   Alcohol abuse   Bilateral leg edema   Chronic venous stasis dermatitis   HTN (hypertension)   UTI (urinary tract infection)    Time spent: 25 minutes    [place] [facility] Pager [phone]. If 7PM-7AM, please contact night-coverage at [email or web address], password [facility] 12/07/2013, 4:10 PM  LOS: 2 days

## 2013-12-07 NOTE — Progress Notes (Signed)
Physical Therapy Treatment Patient Details Name: David Key MRN: [id number] DOB: Dec 14, 1935 Today's Date: 12-18-13    History of Present Illness s/p fall resulting in L humerus fx; PMHx: HTN, arthritis, hip surgery    PT Comments    Pt OOB in recliner several hours. Assisted back to bed + 2 assist and increased assist due to getting out of lower level chair.    Follow Up Recommendations  SNF     Equipment Recommendations       Recommendations for Other Services       Precautions / Restrictions Precautions Precautions: Fall Required Braces or Orthoses: Sling;Other Brace/Splint Other Brace/Splint: shoulder immobilizer  Restrictions Weight Bearing Restrictions: Yes LUE Weight Bearing: Non weight bearing    Mobility  Bed Mobility Overal bed mobility: Needs Assistance Bed Mobility: Sit to Supine     Supine to sit: Max assist Sit to supine: +2 for physical assistance;Total assist   General bed mobility comments: increased assist required to transition pt from EOB to supine with increased time to position to comfort.  Transfers Overall transfer level: Needs assistance Equipment used: None Transfers: Stand Pivot Transfers   Stand pivot transfers: +2 physical assistance;+2 safety/equipment;Max assist       General transfer comment: increased assist from lower level recliner.    Ambulation/Gait   General Gait Details: just a few short side steps back to bed   Stairs            Wheelchair Mobility    Modified Rankin (Stroke Patients Only)       Balance                                    Cognition Arousal/Alertness: Awake/alert Behavior During Therapy: WFL for tasks assessed/performed Overall Cognitive Status: Within Functional Limits for tasks assessed                      Exercises   General Comments        Pertinent Vitals/Pain C/o 8/10 L UE pain meds requested    Home Living                       Prior Function            PT Goals (current goals can now be found in the care plan section) Progress towards PT goals: Progressing toward goals    Frequency  Min 3X/week    PT Plan      Co-evaluation             End of Session Equipment Utilized During Treatment: Gait belt Activity Tolerance: Patient tolerated treatment well Patient left: in chair;with call bell/phone within reach;with family/visitor present     Time: 9562-1308 PT Time Calculation (min): 18 min  Charges:   $Therapeutic Activity: 8-22 mins                    G Codes:      Donnel Saxon Danelle Curiale Dec 18, 2013, 4:03 PM

## 2013-12-07 NOTE — Progress Notes (Signed)
Occupational Therapy Treatment Patient Details Name: Uziel Covault MRN: [id number] DOB: August 15, 1935 Today's Date: 12/07/2013    History of present illness s/p fall resulting in L humerus fx; PMHx: HTN, arthritis, hip surgery   OT comments  Decreased edema noted in L fingers           Precautions / Restrictions Precautions Precautions: Fall Required Braces or Orthoses: Sling;Other Brace/Splint Other Brace/Splint: shoulder immobilizer  Restrictions Weight Bearing Restrictions: Yes LUE Weight Bearing: Non weight bearing       Mobility Bed Mobility        NT- pt up in chair          Transfers          NT                ADL                                         General ADL Comments: Continued to focus on edema control with positioning, retrograde massage and finger ROM. Pts needs VC to perform massage and ROM correctly.  Edema was deceased today but still significant.  Pt agrees he will need rehab prior to DC home.                  Cognition   Behavior During Therapy: WFL for tasks assessed/performed Overall Cognitive Status: Within Functional Limits for tasks assessed                         Exercises Hand Exercises Digit Composite Flexion: AROM;Left;5 reps Composite Extension: 5 reps;Left;AROM Thumb Abduction: AROM;Left;5 reps Thumb Adduction: Left;AROM;5 reps Opposition: AAROM;Left           Pertinent Vitals/ Pain       Increased pain with moving fingers- specifically thumb      Progress Toward Goals  OT Goals(current goals can now be found in the care plan section)   progressing     Plan   SNF         Activity Tolerance  good! Pt did report pain but was able to complete and work with OT    Patient Left  In chair with call bell with in reach and sister in law present           Time: [phone] OT Time Calculation (min): 25 min  Charges: OT General Charges $OT Visit: 1 Procedure OT  Treatments $Therapeutic Exercise: 23-37 mins  Betsy Pries 12/07/2013, 12:34 PM

## 2013-12-08 DIAGNOSIS — M25519 Pain in unspecified shoulder: Secondary | ICD-10-CM | POA: Diagnosis not present

## 2013-12-08 DIAGNOSIS — S42309A Unspecified fracture of shaft of humerus, unspecified arm, initial encounter for closed fracture: Secondary | ICD-10-CM | POA: Diagnosis not present

## 2013-12-08 LAB — BASIC METABOLIC PANEL
BUN: 8 mg/dL (ref 6–23)
CALCIUM: 8.5 mg/dL (ref 8.4–10.5)
CO2: 26 mEq/L (ref 19–32)
CREATININE: 0.61 mg/dL (ref 0.50–1.35)
Chloride: 96 mEq/L (ref 96–112)
GFR calc Af Amer: >90 mL/min (ref >90)
GLUCOSE: 108 mg/dL — AB (ref 70–99)
POTASSIUM: 4.1 meq/L (ref 3.7–5.3)
Sodium: 133 mEq/L — ABNORMAL LOW (ref 137–147)

## 2013-12-08 LAB — OSMOLALITY: Osmolality: 271 mOsm/kg — ABNORMAL LOW (ref 275–300)

## 2013-12-08 LAB — CBC
HEMATOCRIT: 28.7 % — AB (ref 39.0–52.0)
HEMOGLOBIN: 10.2 g/dL — AB (ref 13.0–17.0)
MCH: 33.1 pg (ref 26.0–34.0)
MCHC: 35.5 g/dL (ref 30.0–36.0)
MCV: 93.2 fL (ref 78.0–100.0)
Platelets: 161 10*3/uL (ref 150–400)
RBC: 3.08 MIL/uL — ABNORMAL LOW (ref 4.22–5.81)
RDW: 14 % (ref 11.5–15.5)
WBC: 6.4 10*3/uL (ref 4.0–10.5)

## 2013-12-08 MED ORDER — HYDROCODONE-ACETAMINOPHEN 5-325 MG PO TABS: 2.0000 | ORAL_TABLET | ORAL | Status: DC | PRN

## 2013-12-08 MED ORDER — OXYCODONE HCL 5 MG PO TABS
5.0000 mg | ORAL_TABLET | ORAL | Status: DC | PRN
  Administered 2013-12-08 (×3): 5 mg via ORAL
  Filled 2013-12-08 (×3): qty 1

## 2013-12-08 NOTE — Progress Notes (Signed)
Occupational Therapy Treatment Patient Details Name: David Key MRN: [id number] DOB: 02-14-1936 Today's Date: 12/08/2013    History of present illness s/p fall resulting in L humerus fx; PMHx: HTN, arthritis, hip surgery   OT comments  Pt doing well with retrograde massage and AROM L hand. Verbal cues needed to massage all fingers. OT performed and then pt performed. Pts LUe has to be elevated on pillow for him to get to his hand.           Precautions / Restrictions Precautions Precautions: Fall Required Braces or Orthoses: Sling;Other Brace/Splint Restrictions Weight Bearing Restrictions: Yes LUE Weight Bearing: Non weight bearing              ADL                                         General ADL Comments: Pt with increased active range with wrist extension, finger extension/flexion,  thumb extension, adduction, abduction                      Exercises Hand Exercises Digit Composite Flexion: AROM;Left;5 reps Composite Extension: 5 reps;Left;AROM Thumb Abduction: AROM;Left;5 reps Thumb Adduction: Left;AROM;5 reps Opposition: AAROM;Left         Progress Toward Goals  OT Goals(current goals can now be found in the care plan section)  Progress towards OT goals: Progressing toward goals     Plan   transition to SNF            Patient Left  In bed with call bell with in reach            Betsy Pries 12/08/2013, 10:44 AM

## 2013-12-08 NOTE — Progress Notes (Signed)
Patient is set to discharge to [facility] today. Patient & sister-in-law, Manus Gunning aware. Discharge packet in [place], [place] aware. [facility] called for transport.   Clinical Social Work Department CLINICAL SOCIAL WORK PLACEMENT NOTE 12/08/2013  Patient:  Key,David  Account Number:  [id number] Admit date:  12/05/2013  Clinical Social Worker:  Renold Genta  Date/time:  12/06/2013 11:13 AM  Clinical Social Work is seeking post-discharge placement for this patient at the following level of care:   SKILLED NURSING   (*CSW will update this form in Epic as items are completed)   12/06/2013  Patient/family provided with [place] Department of Clinical Social Work's list of facilities offering this level of care within the geographic area requested by the patient (or if unable, by the patient's family).  12/06/2013  Patient/family informed of their freedom to choose among providers that offer the needed level of care, that participate in Medicare, Medicaid or managed care program needed by the patient, have an available bed and are willing to accept the patient.  12/06/2013  Patient/family informed of [facility]' ownership interest in [facility], as well as of the fact that they are under no obligation to receive care at this facility.  PASARR submitted to EDS on 12/06/2013 PASARR number received from EDS on 12/06/2013  FL2 transmitted to all facilities in geographic area requested by pt/family on  12/06/2013 FL2 transmitted to all facilities within larger geographic area on   Patient informed that his/her managed care company has contracts with or will negotiate with  certain facilities, including the following:     Patient/family informed of bed offers received:  12/08/2013 Patient chooses bed at [facility] Physician recommends and patient chooses bed at    Patient to be transferred to [facility] on   12/08/2013 Patient to be transferred to facility by [facility]  The following physician request were entered in Epic:   Additional Comments:   Raynaldo Opitz, [place] Worker cell #: [phone]

## 2013-12-08 NOTE — Progress Notes (Signed)
Physical Therapy Treatment Patient Details Name: David Key MRN: [id number] DOB: [birth year] Today's Date: 12/08/2013    History of Present Illness Pt is a 78 year old male s/p fall resulting in L humerus fx; PMHx: HTN, arthritis, hip surgery, also reports back hx    PT Comments    Pt ambulated in hallway good distance however still requires increased assist for bed mobility and transferring.  Pt also performed LE exercises upon return to bed. Pt states he will likely d/c to SNF today.  Follow Up Recommendations  SNF     Equipment Recommendations  None recommended by PT    Recommendations for Other Services       Precautions / Restrictions Precautions Precautions: Fall Required Braces or Orthoses: Sling;Other Brace/Splint Other Brace/Splint: shoulder immobilizer  Restrictions Weight Bearing Restrictions: Yes LUE Weight Bearing: Non weight bearing    Mobility  Bed Mobility Overal bed mobility: Needs Assistance Bed Mobility: Sit to Supine       Sit to supine: Max assist   General bed mobility comments: assist for descent of trunk and bringing LEs onto bed, pt reports pain in L arm with transfer  Transfers Overall transfer level: Needs assistance Equipment used: None Transfers: Sit to/from Stand Sit to Stand: Mod assist         General transfer comment: using a flat bed sheet around pt's waist, assisted with sit to stand by pulling forward on sheet to assist upper body up and over lower body during sit to stand with cues for hip flexion then extension  Ambulation/Gait Ambulation/Gait assistance: +2 safety/equipment;Min assist Ambulation Distance (Feet): 100 Feet Assistive device: Hemi-walker Gait Pattern/deviations: Step-to pattern;Wide base of support;Trunk flexed;Decreased stride length;Decreased dorsiflexion - right;Decreased dorsiflexion - left Gait velocity: decreased   General Gait Details: pt ambulated in increased trunk flexion and wide BOS, reports  difficulty correcting these due to his back hx   Stairs            Wheelchair Mobility    Modified Rankin (Stroke Patients Only)       Balance                                    Cognition Arousal/Alertness: Awake/alert Behavior During Therapy: WFL for tasks assessed/performed Overall Cognitive Status: Within Functional Limits for tasks assessed                      Exercises General Exercises - Lower Extremity Ankle Circles/Pumps: AROM;Both;15 reps;Supine Short Arc Quad: AROM;Both;10 reps;Supine Heel Slides: AROM;Both;10 reps;Supine Hip ABduction/ADduction: AROM;Both;10 reps;Supine Straight Leg Raises: Both;AAROM;10 reps;Supine     General Comments        Pertinent Vitals/Pain No c/o except upon return back to bed: L UE pain, repositioned, elevated L UE    Home Living                      Prior Function            PT Goals (current goals can now be found in the care plan section) Progress towards PT goals: Progressing toward goals    Frequency  Min 3X/week    PT Plan Current plan remains appropriate    Co-evaluation             End of Session Equipment Utilized During Treatment: Gait belt Activity Tolerance: Patient tolerated treatment well Patient left: with call bell/phone within  reach;with family/visitor present;in bed     Time: 2774-1287 PT Time Calculation (min): 26 min  Charges:  $Gait Training: 8-22 mins $Therapeutic Exercise: 8-22 mins                    G Codes:      Junius Argyle 12/08/2013, 11:50 AM Carmelia Bake, PT, DPT 12/08/2013 Pager: [phone]

## 2013-12-08 NOTE — Discharge Summary (Signed)
Physician Discharge Summary  David Key [id number] DOB: 02-13-1936 DOA: 12/05/2013  PCP: Wenda Low, MD  Admit date: 12/05/2013 Discharge date: 12/08/2013  Time spent: > 35 minutes  Recommendations for Outpatient Follow-up:  1. Please see d/c instructions below. 2. Given history of patient having orthostatic related symptoms I have discontinued amlodipine.  Would consider d/c'ing benazepril should patient continue to have problems moving forward. 3. Recommend reassessing serum sodium levels in 1 week  Discharge Diagnoses:  Active Problems:   Closed fracture of left humerus   Pre-syncope   Hyponatremia   Alcohol abuse   Bilateral leg edema   Chronic venous stasis dermatitis   HTN (hypertension)   UTI (urinary tract infection)   Discharge Condition: stable  Diet recommendation: heart healthy  Filed Weights   12/05/13 0951  Weight: 101.243 kg (223 lb 3.2 oz)    History of present illness:  78 y.o. male has a past medical history significant for HTN, arthritis s/p hip replacement surgery per Dr. Maureen Ralphs in 2011, presented to the ED with a chief complaint of left arm pain after a mechanical fall. Patient denies any syncopal episodes, LOC, chest pain. He states that he tripped and that caused his fall.  During his hospital stay patient was found to have presyncopal episode and plans were made to transition patient to SNF.   Hospital Course:  1. Left humerus fracture- orthopedics has seen the patient in splint has been applied. No surgery at this time as per orthopedics. Patient to follow up orthopedics in 2 weeks.  2.  Left radial nerve palsy- patient has left radial nerve palsy, and ortho recommends active ROM and PT to see if it recovers. Continue with PT at SNF.   3. Alcohol abuse- No signs of anxiety on discharge. Continue to recommend cessation.  4. Presyncope- resolved. Patient to transition to SNF.  Would continue fall precautions at SNF.  Also have discontinued  amlodipine as presyncope was related to orthostatic changes  5. Hypertension- Continue benazepril. Given age would allow for higher blood pressure of 140/90  6.  UTI- patient has abnormal UA, started on ciprofloxacin. Urine culture showed no growth. Cipro discontinued  7. Hyponatremia- Sodium is 133 on day of d/c. Improving. Will recommend pcp at SNF continue to monitor.  8. Anemia- Hb dropped from 13.6--10.7, ? dilutional, repeat cbc in am.   Procedures:  None  Consultations:  Ortho  Discharge Exam: Filed Vitals:   12/08/13 0558  BP: 126/66  Pulse: 94  Temp: 98.1 F (36.7 C)  Resp: 17    General: Pt in NAD, alert and awake Cardiovascular: RRR, no MRG Respiratory: CTA BL, no wheezes  Discharge Instructions You were cared for by a hospitalist during your hospital stay. If you have any questions about your discharge medications or the care you received while you were in the hospital after you are discharged, you can call the unit and asked to speak with the hospitalist on call if the hospitalist that took care of you is not available. Once you are discharged, your primary care physician will handle any further medical issues. Please note that NO REFILLS for any discharge medications will be authorized once you are discharged, as it is imperative that you return to your primary care physician (or establish a relationship with a primary care physician if you do not have one) for your aftercare needs so that they can reassess your need for medications and monitor your lab values.  Discharge Instructions   Call MD for:  severe uncontrolled pain    Complete by:  As directed      Call MD for:  temperature >100.4    Complete by:  As directed      Diet - low sodium heart healthy    Complete by:  As directed      Discharge instructions    Complete by:  As directed   Pt needs to follow up with Orthopaedic surgeon in 2 weeks.     Increase activity slowly    Complete by:  As directed              Medication List    STOP taking these medications       amLODipine 5 MG tablet  Commonly known as:  NORVASC      TAKE these medications       benazepril 10 MG tablet  Commonly known as:  LOTENSIN  Take 10 mg by mouth daily.     HYDROcodone-acetaminophen 5-325 MG per tablet  Commonly known as:  NORCO/VICODIN  Take 2 tablets by mouth every 4 (four) hours as needed.     ICAPS Caps  Take 1 capsule by mouth daily.     Vitamin D 2000 UNITS tablet  Take 2,000 Units by mouth daily.       Allergies  Allergen Reactions  . Penicillins Hives       Follow-up Information   Follow up with GIOFFRE,RONALD A, MD. (on Tuesday morning - go to the office after 8:30 and you will be seen)    Specialty:  Orthopedic Surgery   Contact information:   [address] [place] [address] [phone]       Follow up with [facility]. (If symptoms worsen including severe pain, numbness or weakness)    Specialty:  Emergency Medicine   Contact information:   [place] [id number] [place] [address] [phone]       The results of significant diagnostics from this hospitalization (including imaging, microbiology, ancillary and laboratory) are listed below for reference.    Significant Diagnostic Studies: Dg Elbow Complete Left  Dec 16, 2013   CLINICAL DATA:  Status post fall; left arm pain.  EXAM: LEFT ELBOW - COMPLETE 3+ VIEW  COMPARISON:  None.  FINDINGS: The known humeral diaphysis fracture is only partially characterized on this study.  There is no evidence of fracture or dislocation about the elbow joint. The visualized joint spaces are preserved. No significant joint effusion is identified. The soft tissues are unremarkable in appearance.  IMPRESSION: Known humeral diaphysis fracture is only partially characterized on this study; no evidence of fracture or dislocation at the elbow joint.   Electronically  Signed   By: Garald Balding M.D.   On: 12-16-2013 06:35   Dg Chest Port 1 View  16-Dec-2013   CLINICAL DATA:  Lightheaded, fall, evaluate for pneumonia  EXAM: PORTABLE CHEST - 1 VIEW  COMPARISON:  Prior chest x-ray 02/15/2011  FINDINGS: Low inspiratory volumes with trace bibasilar atelectasis. Otherwise, the lungs are clear. Cardiac and mediastinal contours are within normal limits given portable frontal technique. Atherosclerotic calcification noted in the transverse aorta. No acute osseous abnormality. No effusion or pneumothorax.  IMPRESSION: Low inspiratory volumes with trace bibasilar atelectasis.  Otherwise, no acute cardiopulmonary process.   Electronically Signed   By: Jacqulynn Cadet M.D.   On: 2013-12-16 09:38   Dg Humerus Left  12/16/13   CLINICAL DATA:  Status post fall; pain at the mid diaphysis  of the left arm.  EXAM: LEFT HUMERUS - 2+ VIEW  COMPARISON:  None.  FINDINGS: There is a comminuted and displaced fracture involving the proximal to mid diaphysis of the left humerus. A displaced butterfly fragment is seen. There is varying angulation depending on positioning.  The left humeral head remains intact; the humeral head remains seated at the glenoid fossa. The left acromioclavicular joint is unremarkable in appearance. Soft tissue swelling is noted about the fracture site. The elbow joint is incompletely assessed, but appears grossly unremarkable.  IMPRESSION: Comminuted and displaced fracture involving the proximal mid diaphysis of the left humerus, with an associated displaced butterfly fragment. Varying angulation noted, depending on positioning.   Electronically Signed   By: Garald Balding M.D.   On: 12/05/2013 06:32    Microbiology: Recent Results (from the past 240 hour(s))  URINE CULTURE     Status: None   Collection Time    12/05/13  1:18 PM      Result Value Ref Range Status   Specimen Description URINE, CLEAN CATCH   Final   Special Requests NONE   Final   Culture  Setup  Time     Final   Value: 12/05/2013 20:00     Performed at [facility]     Final   Value: NO GROWTH     Performed at [facility]   Culture     Final   Value: NO GROWTH     Performed at [facility]   Report Status 12/06/2013 FINAL   Final     Labs: Basic Metabolic Panel:  Recent Labs Lab 12/05/13 0621 12/06/13 0341 12/07/13 0420 12/08/13 0350  NA 131* 131* 131* 133*  K 4.8 4.0 4.0 4.1  CL 96 93* 94* 96  CO2  --  27 28 26   GLUCOSE 119* 122* 110* 108*  BUN 12 9 7 8   CREATININE 1.00 0.52 0.64 0.61  CALCIUM  --  8.7 8.5 8.5   Liver Function Tests:  Recent Labs Lab 12/05/13 0918 12/06/13 0341  AST 49* 42*  ALT 28 23  ALKPHOS 74 65  BILITOT 0.8 1.7*  PROT 6.7 6.4  ALBUMIN 3.5 3.3*   No results found for this basename: LIPASE, AMYLASE,  in the last 168 hours No results found for this basename: AMMONIA,  in the last 168 hours CBC:  Recent Labs Lab 12/05/13 0610 12/05/13 0621 12/06/13 0341 12/08/13 0350  WBC 9.1  --  6.2 6.4  HGB 12.8* 13.6 10.7* 10.2*  HCT 36.8* 40.0 30.8* 28.7*  MCV 92.7  --  92.8 93.2  PLT 172  --  147* 161   Cardiac Enzymes: No results found for this basename: CKTOTAL, CKMB, CKMBINDEX, TROPONINI,  in the last 168 hours BNP: BNP (last 3 results)  Recent Labs  12/05/13 0918  PROBNP 56.2   CBG:  Recent Labs Lab 12/05/13 0747  GLUCAP 119*       Signed:  [place]  [facility] 12/08/2013, 11:53 AM

## 2013-12-10 DIAGNOSIS — S42309A Unspecified fracture of shaft of humerus, unspecified arm, initial encounter for closed fracture: Secondary | ICD-10-CM | POA: Diagnosis not present

## 2013-12-10 DIAGNOSIS — I10 Essential (primary) hypertension: Secondary | ICD-10-CM | POA: Diagnosis not present

## 2013-12-10 DIAGNOSIS — E876 Hypokalemia: Secondary | ICD-10-CM | POA: Diagnosis not present

## 2013-12-10 DIAGNOSIS — R609 Edema, unspecified: Secondary | ICD-10-CM | POA: Diagnosis not present

## 2013-12-14 DIAGNOSIS — S42309A Unspecified fracture of shaft of humerus, unspecified arm, initial encounter for closed fracture: Secondary | ICD-10-CM | POA: Diagnosis not present

## 2013-12-21 DIAGNOSIS — S42309D Unspecified fracture of shaft of humerus, unspecified arm, subsequent encounter for fracture with routine healing: Secondary | ICD-10-CM | POA: Diagnosis not present

## 2013-12-24 DIAGNOSIS — M6281 Muscle weakness (generalized): Secondary | ICD-10-CM | POA: Diagnosis not present

## 2013-12-24 DIAGNOSIS — S42309A Unspecified fracture of shaft of humerus, unspecified arm, initial encounter for closed fracture: Secondary | ICD-10-CM | POA: Diagnosis not present

## 2013-12-24 DIAGNOSIS — I10 Essential (primary) hypertension: Secondary | ICD-10-CM | POA: Diagnosis not present

## 2013-12-24 DIAGNOSIS — L270 Generalized skin eruption due to drugs and medicaments taken internally: Secondary | ICD-10-CM | POA: Diagnosis not present

## 2013-12-29 DIAGNOSIS — M25529 Pain in unspecified elbow: Secondary | ICD-10-CM | POA: Diagnosis not present

## 2013-12-31 DIAGNOSIS — R609 Edema, unspecified: Secondary | ICD-10-CM | POA: Diagnosis not present

## 2013-12-31 DIAGNOSIS — I10 Essential (primary) hypertension: Secondary | ICD-10-CM | POA: Diagnosis not present

## 2013-12-31 DIAGNOSIS — S42309A Unspecified fracture of shaft of humerus, unspecified arm, initial encounter for closed fracture: Secondary | ICD-10-CM | POA: Diagnosis not present

## 2014-01-11 DIAGNOSIS — S42309D Unspecified fracture of shaft of humerus, unspecified arm, subsequent encounter for fracture with routine healing: Secondary | ICD-10-CM | POA: Diagnosis not present

## 2014-01-13 DIAGNOSIS — L851 Acquired keratosis [keratoderma] palmaris et plantaris: Secondary | ICD-10-CM | POA: Diagnosis not present

## 2014-01-13 DIAGNOSIS — R609 Edema, unspecified: Secondary | ICD-10-CM | POA: Diagnosis not present

## 2014-01-13 DIAGNOSIS — S42309A Unspecified fracture of shaft of humerus, unspecified arm, initial encounter for closed fracture: Secondary | ICD-10-CM | POA: Diagnosis not present

## 2014-01-13 DIAGNOSIS — I10 Essential (primary) hypertension: Secondary | ICD-10-CM | POA: Diagnosis not present

## 2014-01-13 DIAGNOSIS — E559 Vitamin D deficiency, unspecified: Secondary | ICD-10-CM | POA: Diagnosis not present

## 2014-01-13 DIAGNOSIS — F1021 Alcohol dependence, in remission: Secondary | ICD-10-CM | POA: Diagnosis not present

## 2014-01-21 DIAGNOSIS — I10 Essential (primary) hypertension: Secondary | ICD-10-CM | POA: Diagnosis not present

## 2014-01-21 DIAGNOSIS — R609 Edema, unspecified: Secondary | ICD-10-CM | POA: Diagnosis not present

## 2014-01-21 DIAGNOSIS — S42309A Unspecified fracture of shaft of humerus, unspecified arm, initial encounter for closed fracture: Secondary | ICD-10-CM | POA: Diagnosis not present

## 2014-01-25 DIAGNOSIS — S42309D Unspecified fracture of shaft of humerus, unspecified arm, subsequent encounter for fracture with routine healing: Secondary | ICD-10-CM | POA: Diagnosis not present

## 2014-02-08 DIAGNOSIS — S42309D Unspecified fracture of shaft of humerus, unspecified arm, subsequent encounter for fracture with routine healing: Secondary | ICD-10-CM | POA: Diagnosis not present

## 2014-02-16 DIAGNOSIS — M79609 Pain in unspecified limb: Secondary | ICD-10-CM | POA: Diagnosis not present

## 2014-02-16 DIAGNOSIS — M7989 Other specified soft tissue disorders: Secondary | ICD-10-CM | POA: Diagnosis not present

## 2014-02-18 DIAGNOSIS — R609 Edema, unspecified: Secondary | ICD-10-CM | POA: Diagnosis not present

## 2014-02-18 DIAGNOSIS — I359 Nonrheumatic aortic valve disorder, unspecified: Secondary | ICD-10-CM | POA: Diagnosis not present

## 2014-02-18 DIAGNOSIS — S42309A Unspecified fracture of shaft of humerus, unspecified arm, initial encounter for closed fracture: Secondary | ICD-10-CM | POA: Diagnosis not present

## 2014-02-18 DIAGNOSIS — M1990 Unspecified osteoarthritis, unspecified site: Secondary | ICD-10-CM | POA: Diagnosis not present

## 2014-02-18 DIAGNOSIS — I10 Essential (primary) hypertension: Secondary | ICD-10-CM | POA: Diagnosis not present

## 2014-02-24 DIAGNOSIS — Z4789 Encounter for other orthopedic aftercare: Secondary | ICD-10-CM | POA: Diagnosis not present

## 2014-03-04 DIAGNOSIS — M459 Ankylosing spondylitis of unspecified sites in spine: Secondary | ICD-10-CM | POA: Diagnosis not present

## 2014-03-04 DIAGNOSIS — I8310 Varicose veins of unspecified lower extremity with inflammation: Secondary | ICD-10-CM | POA: Diagnosis not present

## 2014-03-04 DIAGNOSIS — S42309D Unspecified fracture of shaft of humerus, unspecified arm, subsequent encounter for fracture with routine healing: Secondary | ICD-10-CM | POA: Diagnosis not present

## 2014-03-04 DIAGNOSIS — R269 Unspecified abnormalities of gait and mobility: Secondary | ICD-10-CM | POA: Diagnosis not present

## 2014-03-16 DIAGNOSIS — I10 Essential (primary) hypertension: Secondary | ICD-10-CM | POA: Diagnosis not present

## 2014-03-16 DIAGNOSIS — D649 Anemia, unspecified: Secondary | ICD-10-CM | POA: Diagnosis not present

## 2014-03-16 DIAGNOSIS — S42309A Unspecified fracture of shaft of humerus, unspecified arm, initial encounter for closed fracture: Secondary | ICD-10-CM | POA: Diagnosis not present

## 2014-03-16 DIAGNOSIS — R609 Edema, unspecified: Secondary | ICD-10-CM | POA: Diagnosis not present

## 2014-05-25 DIAGNOSIS — C61 Malignant neoplasm of prostate: Secondary | ICD-10-CM | POA: Diagnosis not present

## 2014-05-25 DIAGNOSIS — S42302A Unspecified fracture of shaft of humerus, left arm, initial encounter for closed fracture: Secondary | ICD-10-CM | POA: Diagnosis not present

## 2014-05-25 DIAGNOSIS — I10 Essential (primary) hypertension: Secondary | ICD-10-CM | POA: Diagnosis not present

## 2014-05-25 DIAGNOSIS — Z23 Encounter for immunization: Secondary | ICD-10-CM | POA: Diagnosis not present

## 2014-05-25 DIAGNOSIS — M459 Ankylosing spondylitis of unspecified sites in spine: Secondary | ICD-10-CM | POA: Diagnosis not present

## 2014-08-29 IMAGING — CR DG HUMERUS 2V *L*
2 series · 2 of 2 positions shown · non-contrast
Comparison: None.

CLINICAL DATA: Status post fall; pain at the mid diaphysis of the
left arm.

EXAM:
LEFT HUMERUS - 2+ VIEW

[x humerus ap left (1 of 2)]
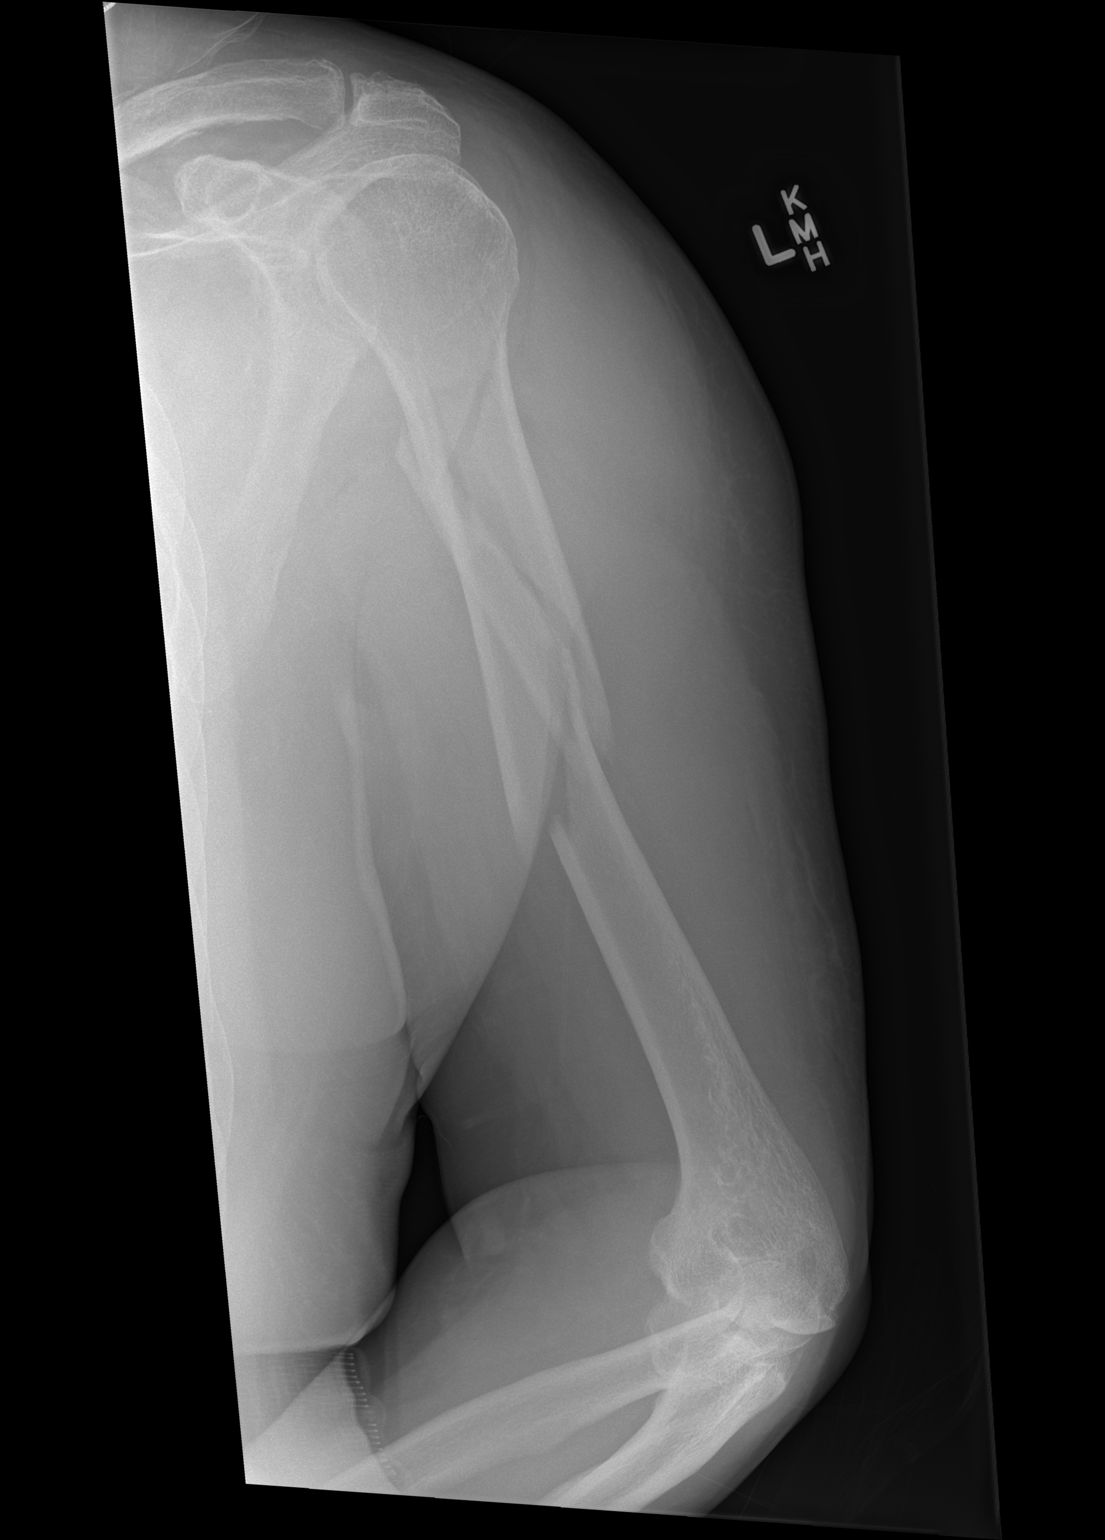

[x humerus ap left (2 of 2)]
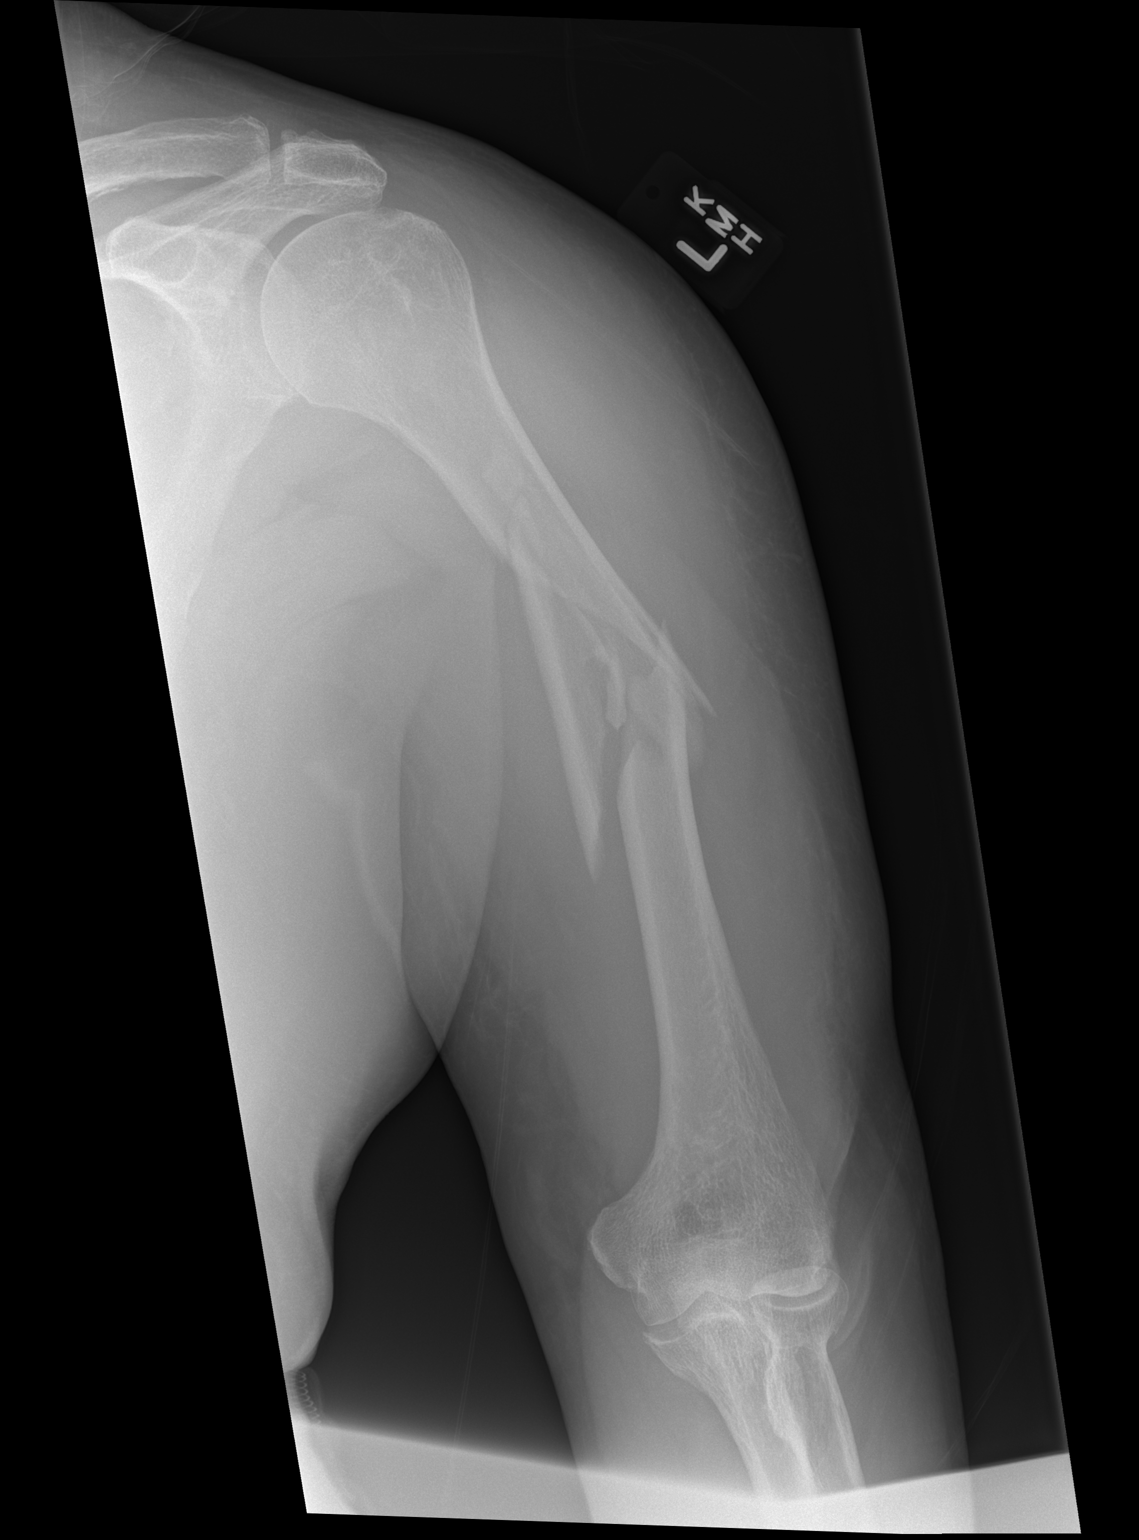

[2 of 2 positions shown; findings below may reference images not displayed]

FINDINGS: There is a comminuted and displaced fracture involving the proximal
to mid diaphysis of the left humerus. A displaced butterfly fragment
is seen. There is varying angulation depending on positioning.

The left humeral head remains intact; the humeral head remains
seated at the glenoid fossa. The left acromioclavicular joint is
unremarkable in appearance. Soft tissue swelling is noted about the
fracture site. The elbow joint is incompletely assessed, but appears
grossly unremarkable.
IMPRESSION: Comminuted and displaced fracture involving the proximal mid
diaphysis of the left humerus, with an associated displaced
butterfly fragment. Varying angulation noted, depending on
positioning.

## 2014-08-29 IMAGING — CR DG CHEST 1V PORT
1 series · 1 of 1 positions shown · non-contrast
Comparison: Prior chest x-ray 02/15/2011

CLINICAL DATA: Lightheaded, fall, evaluate for pneumonia

EXAM:
PORTABLE CHEST - 1 VIEW

[AP]
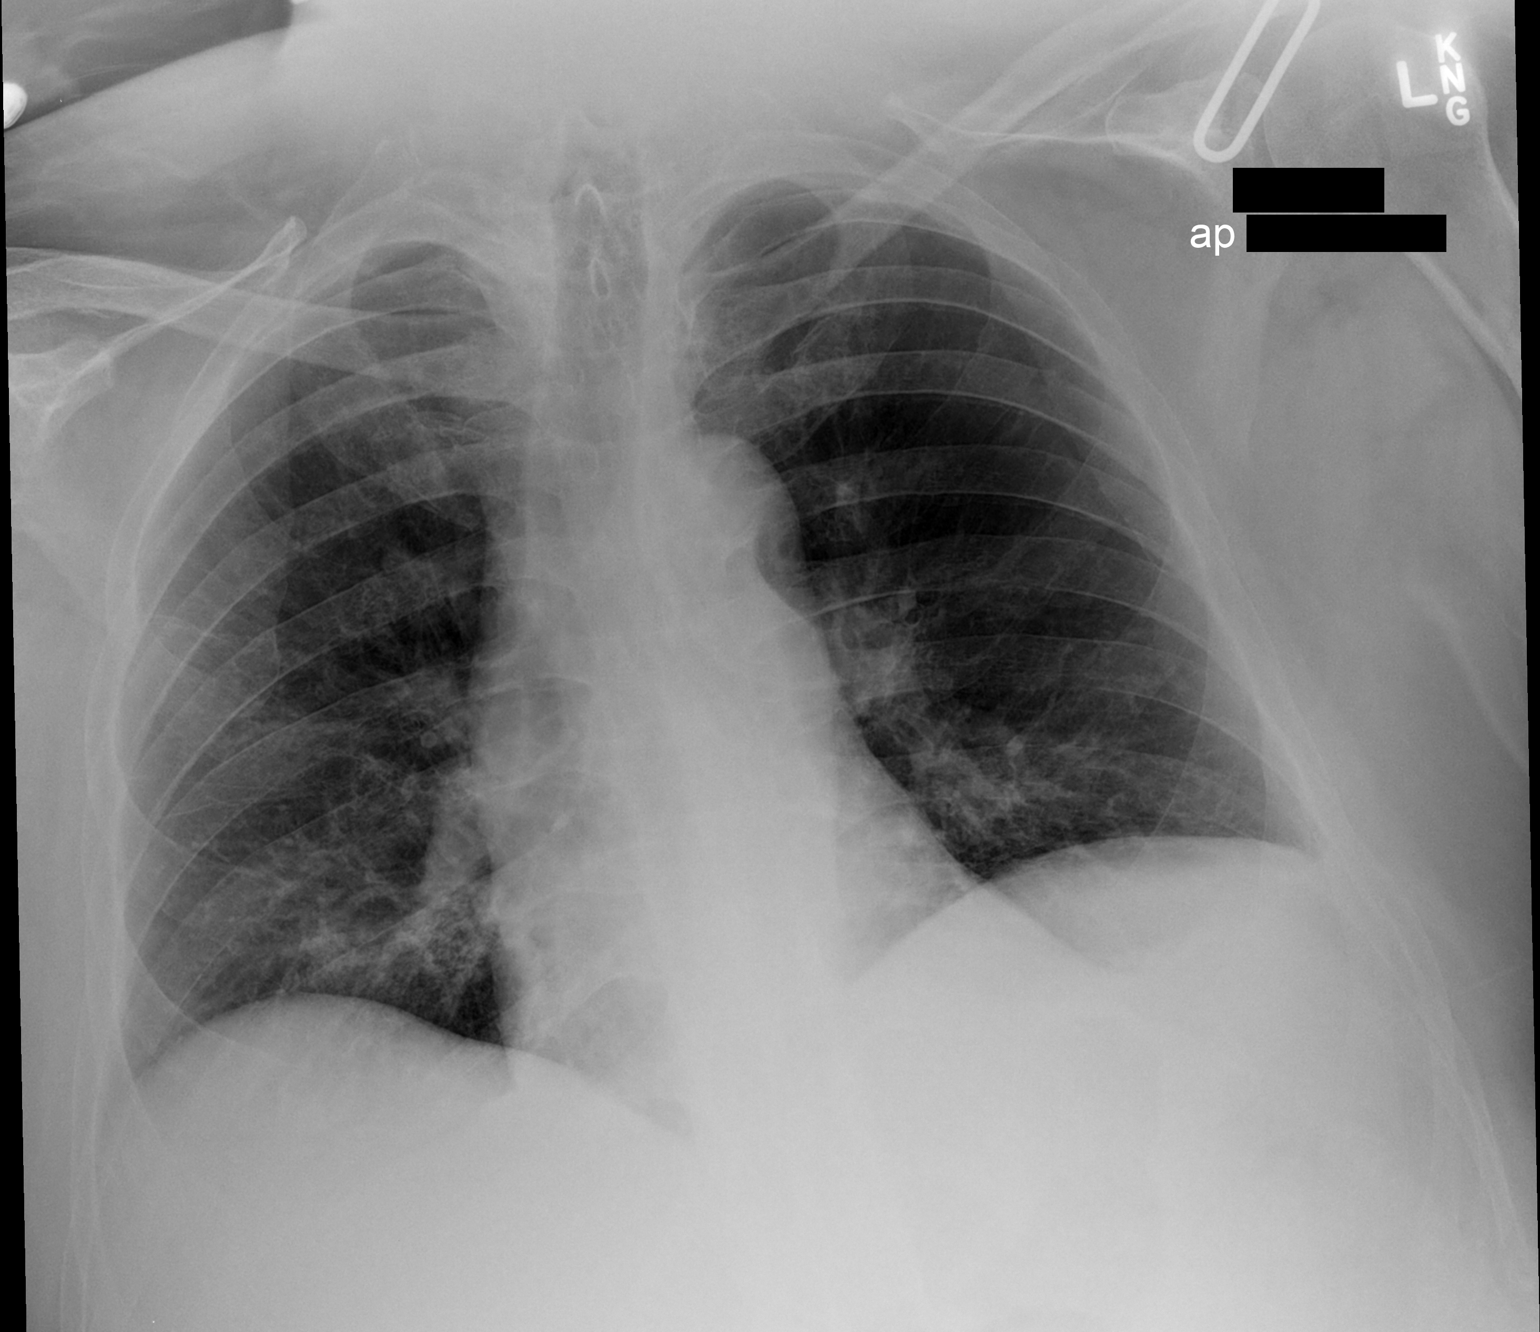

[1 of 1 positions shown; findings below may reference images not displayed]

FINDINGS: Low inspiratory volumes with trace bibasilar atelectasis. Otherwise,
the lungs are clear. Cardiac and mediastinal contours are within
normal limits given portable frontal technique. Atherosclerotic
calcification noted in the transverse aorta. No acute osseous
abnormality. No effusion or pneumothorax.
IMPRESSION: Low inspiratory volumes with trace bibasilar atelectasis.

Otherwise, no acute cardiopulmonary process.

## 2014-10-13 DIAGNOSIS — C61 Malignant neoplasm of prostate: Secondary | ICD-10-CM | POA: Diagnosis not present

## 2014-10-13 DIAGNOSIS — Z1389 Encounter for screening for other disorder: Secondary | ICD-10-CM | POA: Diagnosis not present

## 2014-10-13 DIAGNOSIS — I10 Essential (primary) hypertension: Secondary | ICD-10-CM | POA: Diagnosis not present

## 2014-10-13 DIAGNOSIS — M453 Ankylosing spondylitis of cervicothoracic region: Secondary | ICD-10-CM | POA: Diagnosis not present

## 2014-10-13 DIAGNOSIS — R600 Localized edema: Secondary | ICD-10-CM | POA: Diagnosis not present

## 2015-05-16 DIAGNOSIS — E782 Mixed hyperlipidemia: Secondary | ICD-10-CM | POA: Diagnosis not present

## 2015-05-16 DIAGNOSIS — Z23 Encounter for immunization: Secondary | ICD-10-CM | POA: Diagnosis not present

## 2015-05-16 DIAGNOSIS — C61 Malignant neoplasm of prostate: Secondary | ICD-10-CM | POA: Diagnosis not present

## 2015-05-16 DIAGNOSIS — I10 Essential (primary) hypertension: Secondary | ICD-10-CM | POA: Diagnosis not present

## 2015-05-16 DIAGNOSIS — R7309 Other abnormal glucose: Secondary | ICD-10-CM | POA: Diagnosis not present

## 2015-05-16 DIAGNOSIS — R600 Localized edema: Secondary | ICD-10-CM | POA: Diagnosis not present

## 2015-05-16 DIAGNOSIS — M459 Ankylosing spondylitis of unspecified sites in spine: Secondary | ICD-10-CM | POA: Diagnosis not present

## 2016-04-26 DIAGNOSIS — E782 Mixed hyperlipidemia: Secondary | ICD-10-CM | POA: Diagnosis not present

## 2016-04-26 DIAGNOSIS — Z23 Encounter for immunization: Secondary | ICD-10-CM | POA: Diagnosis not present

## 2016-04-26 DIAGNOSIS — I10 Essential (primary) hypertension: Secondary | ICD-10-CM | POA: Diagnosis not present

## 2016-04-26 DIAGNOSIS — R7309 Other abnormal glucose: Secondary | ICD-10-CM | POA: Diagnosis not present

## 2016-04-26 DIAGNOSIS — K760 Fatty (change of) liver, not elsewhere classified: Secondary | ICD-10-CM | POA: Diagnosis not present

## 2016-04-26 DIAGNOSIS — J309 Allergic rhinitis, unspecified: Secondary | ICD-10-CM | POA: Diagnosis not present

## 2016-04-26 DIAGNOSIS — K279 Peptic ulcer, site unspecified, unspecified as acute or chronic, without hemorrhage or perforation: Secondary | ICD-10-CM | POA: Diagnosis not present

## 2016-04-26 DIAGNOSIS — Z0000 Encounter for general adult medical examination without abnormal findings: Secondary | ICD-10-CM | POA: Diagnosis not present

## 2016-04-26 DIAGNOSIS — M459 Ankylosing spondylitis of unspecified sites in spine: Secondary | ICD-10-CM | POA: Diagnosis not present

## 2016-04-26 DIAGNOSIS — C61 Malignant neoplasm of prostate: Secondary | ICD-10-CM | POA: Diagnosis not present

## 2016-04-26 DIAGNOSIS — Z1389 Encounter for screening for other disorder: Secondary | ICD-10-CM | POA: Diagnosis not present

## 2016-04-26 DIAGNOSIS — R600 Localized edema: Secondary | ICD-10-CM | POA: Diagnosis not present

## 2016-04-26 DIAGNOSIS — I872 Venous insufficiency (chronic) (peripheral): Secondary | ICD-10-CM | POA: Diagnosis not present

## 2016-09-12 DIAGNOSIS — I8312 Varicose veins of left lower extremity with inflammation: Secondary | ICD-10-CM | POA: Diagnosis not present

## 2016-09-12 DIAGNOSIS — I872 Venous insufficiency (chronic) (peripheral): Secondary | ICD-10-CM | POA: Diagnosis not present

## 2016-09-12 DIAGNOSIS — I8311 Varicose veins of right lower extremity with inflammation: Secondary | ICD-10-CM | POA: Diagnosis not present

## 2016-09-12 DIAGNOSIS — L304 Erythema intertrigo: Secondary | ICD-10-CM | POA: Diagnosis not present

## 2017-02-19 ENCOUNTER — Encounter (HOSPITAL_BASED_OUTPATIENT_CLINIC_OR_DEPARTMENT_OTHER): Payer: Self-pay | Admitting: Emergency Medicine

## 2017-02-19 ENCOUNTER — Emergency Department (HOSPITAL_BASED_OUTPATIENT_CLINIC_OR_DEPARTMENT_OTHER): Payer: Medicare Other

## 2017-02-19 ENCOUNTER — Emergency Department (HOSPITAL_BASED_OUTPATIENT_CLINIC_OR_DEPARTMENT_OTHER)
Admission: EM | Admit: 2017-02-19 | Discharge: 2017-02-19 | Disposition: A | Discharge status: Home / Self Care | Payer: Medicare Other | Attending: Emergency Medicine | Admitting: Emergency Medicine

## 2017-02-19 DIAGNOSIS — I10 Essential (primary) hypertension: Secondary | ICD-10-CM | POA: Diagnosis not present

## 2017-02-19 DIAGNOSIS — Z87891 Personal history of nicotine dependence: Secondary | ICD-10-CM | POA: Diagnosis not present

## 2017-02-19 DIAGNOSIS — S0990XA Unspecified injury of head, initial encounter: Secondary | ICD-10-CM | POA: Diagnosis not present

## 2017-02-19 DIAGNOSIS — S0101XA Laceration without foreign body of scalp, initial encounter: Secondary | ICD-10-CM | POA: Diagnosis not present

## 2017-02-19 DIAGNOSIS — W1830XA Fall on same level, unspecified, initial encounter: Secondary | ICD-10-CM | POA: Insufficient documentation

## 2017-02-19 DIAGNOSIS — Y999 Unspecified external cause status: Secondary | ICD-10-CM | POA: Diagnosis not present

## 2017-02-19 DIAGNOSIS — W19XXXA Unspecified fall, initial encounter: Secondary | ICD-10-CM

## 2017-02-19 DIAGNOSIS — Y929 Unspecified place or not applicable: Secondary | ICD-10-CM | POA: Diagnosis not present

## 2017-02-19 DIAGNOSIS — Y9389 Activity, other specified: Secondary | ICD-10-CM | POA: Diagnosis not present

## 2017-02-19 DIAGNOSIS — S1121XA Laceration without foreign body of pharynx and cervical esophagus, initial encounter: Secondary | ICD-10-CM | POA: Diagnosis not present

## 2017-02-19 DIAGNOSIS — S0180XA Unspecified open wound of other part of head, initial encounter: Secondary | ICD-10-CM | POA: Diagnosis not present

## 2017-02-19 DIAGNOSIS — R4182 Altered mental status, unspecified: Secondary | ICD-10-CM | POA: Diagnosis not present

## 2017-02-19 DIAGNOSIS — S0191XA Laceration without foreign body of unspecified part of head, initial encounter: Secondary | ICD-10-CM | POA: Diagnosis not present

## 2017-02-19 NOTE — ED Triage Notes (Signed)
Pt reports he was getting up and lost his balance, when he reached for his walker he grabbed his grabbing stick instead. Pt hit head, laceration to right side of head. Pt a/o x 4 . Pt denies LOC and is not on blood thinners.

## 2017-02-19 NOTE — ED Provider Notes (Signed)
[place] [facility] Provider Note   CSN: [id number] Arrival date & time: 02/19/17  0212     History   Chief Complaint Chief Complaint  Patient presents with  . Fall    HPI Lashon Hillier is a 81 y.o. male.  The history is provided by the patient.  Fall  This is a new problem. The current episode started less than 1 hour ago. The problem occurs constantly. The problem has not changed since onset.Pertinent negatives include no chest pain, no abdominal pain and no shortness of breath. Nothing aggravates the symptoms. Nothing relieves the symptoms. He has tried nothing for the symptoms. The treatment provided no relief.    Past Medical History:  Diagnosis Date  . Arthritis   . Hypertension     Patient Active Problem List   Diagnosis Date Noted  . Closed fracture of left humerus 12/05/2013  . Pre-syncope 12/05/2013  . Hyponatremia 12/05/2013  . Alcohol abuse 12/05/2013  . Bilateral leg edema 12/05/2013  . Chronic venous stasis dermatitis 12/05/2013  . HTN (hypertension) 12/05/2013  . UTI (urinary tract infection) 12/05/2013    Past Surgical History:  Procedure Laterality Date  . HIP SURGERY         Home Medications    Prior to Admission medications   Medication Sig Start Date End Date Taking? Authorizing Provider  FUROSEMIDE PO Take by mouth.   Yes [provider]  benazepril (LOTENSIN) 10 MG tablet Take 10 mg by mouth daily.    [provider]    Family History History reviewed. No pertinent family history.  Social History Social History  Substance Use Topics  . Smoking status: Former Research scientist (life sciences)  . Smokeless tobacco: Never Used  . Alcohol use Yes     Comment: daily     Allergies   Penicillins   Review of Systems Review of Systems  Respiratory: Negative for shortness of breath.   Cardiovascular: Negative for chest pain.  Gastrointestinal: Negative for abdominal pain.  Musculoskeletal: Negative for back pain, gait problem, joint  swelling, myalgias and neck pain.  Skin: Positive for wound.  All other systems reviewed and are negative.    Physical Exam Updated Vital Signs BP 129/66   Pulse (!) 104   Temp 98.5 F (36.9 C) (Oral)   Resp 18   Ht 5\' 8"  (1.727 m)   Wt 104.3 kg (230 lb)   SpO2 98%   BMI 34.97 kg/m   Physical Exam  Constitutional: He is oriented to person, place, and time. He appears well-developed and well-nourished. No distress.  HENT:  Head:    Right Ear: External ear normal.  Left Ear: External ear normal.  Mouth/Throat: Oropharynx is clear and moist. No oropharyngeal exudate.  Eyes: Pupils are equal, round, and reactive to light. Conjunctivae are normal.  Neck: Normal range of motion. Neck supple. No JVD present.  Cardiovascular: Normal rate, regular rhythm, normal heart sounds and intact distal pulses.   Pulmonary/Chest: Effort normal and breath sounds normal. No stridor. No respiratory distress. He has no wheezes.  Abdominal: Soft. Bowel sounds are normal. He exhibits no mass. There is no tenderness. There is no rebound and no guarding.  Musculoskeletal: Normal range of motion. He exhibits no tenderness.  Neurological: He is alert and oriented to person, place, and time. He displays normal reflexes.  Skin: Skin is warm and dry. Capillary refill takes less than 2 seconds.  Psychiatric: He has a normal mood and affect.     ED Treatments /  Results   Vitals:   02/19/17 0213  BP: 129/66  Pulse: (!) 104  Resp: 18  Temp: 98.5 F (36.9 C)     Radiology Ct Head Wo Contrast  Result Date: 02/19/2017 CLINICAL DATA:  Status post fall, with laceration at the right vertex and chronic neck pain. Initial encounter. EXAM: CT HEAD WITHOUT CONTRAST CT CERVICAL SPINE WITHOUT CONTRAST TECHNIQUE: Multidetector CT imaging of the head and cervical spine was performed following the standard protocol without intravenous contrast. Multiplanar CT image reconstructions of the cervical spine were also  generated. COMPARISON:  MRI of the cervical spine performed 02/25/2007 FINDINGS: CT HEAD FINDINGS Brain: No evidence of acute infarction, hemorrhage, hydrocephalus, extra-axial collection or mass lesion/mass effect. Prominence of the ventricles and sulci reflects moderate cortical volume loss. Cerebellar atrophy is noted. Scattered periventricular white matter change likely reflects small vessel ischemic microangiopathy. The brainstem and fourth ventricle are within normal limits. The basal ganglia are unremarkable in appearance. The cerebral hemispheres demonstrate grossly normal gray-white differentiation. No mass effect or midline shift is seen. Vascular: No hyperdense vessel or unexpected calcification. Skull: There is no evidence of fracture; visualized osseous structures are unremarkable in appearance. Sinuses/Orbits: The orbits are within normal limits. The paranasal sinuses and mastoid air cells are well-aerated. Other: A soft tissue laceration is noted at the right vertex. CT CERVICAL SPINE FINDINGS Alignment: Normal. Skull base and vertebrae: No acute fracture. No primary bone lesion or focal pathologic process. Soft tissues and spinal canal: No prevertebral fluid or swelling. No visible canal hematoma. Disc levels: Anterior bridging osteophytes are noted along the cervical spine. Upper chest: Calcification is noted at the carotid bifurcations bilaterally. The minimally visualized lung apices are clear. Other: No additional soft tissue abnormalities are seen. IMPRESSION: 1. No evidence of traumatic intracranial injury or fracture. 2. No evidence of acute fracture or subluxation along the cervical spine. 3. Soft tissue laceration at the right vertex. 4. Moderate cortical volume loss and scattered small vessel ischemic microangiopathy. 5. Calcification at the carotid bifurcations bilaterally. Carotid ultrasound would be helpful for further evaluation, when and as deemed clinically appropriate. 6. Diffuse  anterior bridging osteophytes along the cervical spine. Electronically Signed   By: Garald Balding M.D.   On: 02/19/2017 03:21   Ct Cervical Spine Wo Contrast  Result Date: 02/19/2017 CLINICAL DATA:  Status post fall, with laceration at the right vertex and chronic neck pain. Initial encounter. EXAM: CT HEAD WITHOUT CONTRAST CT CERVICAL SPINE WITHOUT CONTRAST TECHNIQUE: Multidetector CT imaging of the head and cervical spine was performed following the standard protocol without intravenous contrast. Multiplanar CT image reconstructions of the cervical spine were also generated. COMPARISON:  MRI of the cervical spine performed 02/25/2007 FINDINGS: CT HEAD FINDINGS Brain: No evidence of acute infarction, hemorrhage, hydrocephalus, extra-axial collection or mass lesion/mass effect. Prominence of the ventricles and sulci reflects moderate cortical volume loss. Cerebellar atrophy is noted. Scattered periventricular white matter change likely reflects small vessel ischemic microangiopathy. The brainstem and fourth ventricle are within normal limits. The basal ganglia are unremarkable in appearance. The cerebral hemispheres demonstrate grossly normal gray-white differentiation. No mass effect or midline shift is seen. Vascular: No hyperdense vessel or unexpected calcification. Skull: There is no evidence of fracture; visualized osseous structures are unremarkable in appearance. Sinuses/Orbits: The orbits are within normal limits. The paranasal sinuses and mastoid air cells are well-aerated. Other: A soft tissue laceration is noted at the right vertex. CT CERVICAL SPINE FINDINGS Alignment: Normal. Skull base and vertebrae: No acute  fracture. No primary bone lesion or focal pathologic process. Soft tissues and spinal canal: No prevertebral fluid or swelling. No visible canal hematoma. Disc levels: Anterior bridging osteophytes are noted along the cervical spine. Upper chest: Calcification is noted at the carotid  bifurcations bilaterally. The minimally visualized lung apices are clear. Other: No additional soft tissue abnormalities are seen. IMPRESSION: 1. No evidence of traumatic intracranial injury or fracture. 2. No evidence of acute fracture or subluxation along the cervical spine. 3. Soft tissue laceration at the right vertex. 4. Moderate cortical volume loss and scattered small vessel ischemic microangiopathy. 5. Calcification at the carotid bifurcations bilaterally. Carotid ultrasound would be helpful for further evaluation, when and as deemed clinically appropriate. 6. Diffuse anterior bridging osteophytes along the cervical spine. Electronically Signed   By: Garald Balding M.D.   On: 02/19/2017 03:21    Procedures .Marland KitchenLaceration Repair Date/Time: 02/19/2017 4:23 AM Performed by: Veatrice Kells Authorized by: Veatrice Kells   Consent:    Consent obtained:  Verbal   Consent given by:  Patient   Risks discussed:  Infection, pain, need for additional repair and poor cosmetic result   Alternatives discussed:  No treatment Anesthesia (see MAR for exact dosages):    Anesthesia method:  None Laceration details:    Location:  Scalp   Scalp location:  Crown   Length (cm):  1.8   Depth (mm):  1 Repair type:    Repair type:  Simple Pre-procedure details:    Preparation:  Patient was prepped and draped in usual sterile fashion Exploration:    Wound exploration: wound explored through full range of motion     Wound extent: no areolar tissue violation noted     Contaminated: no   Treatment:    Area cleansed with:  Betadine   Amount of cleaning:  Standard Skin repair:    Repair method:  Staples   Number of staples:  5 Approximation:    Approximation:  Close   Vermilion border: well-aligned   Post-procedure details:    Dressing:  Sterile dressing   Patient tolerance of procedure:  Tolerated well, no immediate complications   (including critical care time)    Final Clinical Impressions(s) /  ED Diagnoses   Final diagnoses:  Fall, initial encounter  Scalp laceration, initial encounter  Staple removal in 7 days at urgent care.  She is very well appearing and has been observed in the ED.  Strict return precautions given for vomiting, distended abdomen,  weakness, inability to tolerate oral liquids or foods, shortness of breath, leg, syncope or any concerns. No signs of systemic illness or infection. The patient is nontoxic-appearing on exam and vital signs are within normal limits.   I have reviewed the triage vital signs and the nursing notes. Pertinent labs &imaging results that were available during my care of the patient were reviewed by me and considered in my medical decision making (see chart for details).  After history, exam, and medical workup I feel the patient has been appropriately medically screened and is safe for discharge home. Pertinent diagnoses were discussed with the patient. Patient was given return precautions.   New Prescriptions New Prescriptions   No medications on file     Lasheena Frieze, MD 02/19/17 0092

## 2017-02-19 NOTE — ED Notes (Signed)
Pt is bandaged and cleaned up, awaiting PTAR

## 2017-02-24 ENCOUNTER — Encounter (HOSPITAL_COMMUNITY): Payer: Self-pay | Admitting: Emergency Medicine

## 2017-02-24 ENCOUNTER — Emergency Department (HOSPITAL_COMMUNITY): Payer: Medicare Other

## 2017-02-24 ENCOUNTER — Inpatient Hospital Stay (HOSPITAL_COMMUNITY)
Admission: EM | Admit: 2017-02-24 | Discharge: 2017-03-01 | DRG: 690 | Disposition: A | Discharge status: Skilled Nursing Facility | Payer: Medicare Other | Attending: Internal Medicine | Admitting: Internal Medicine

## 2017-02-24 DIAGNOSIS — Z880 Allergy status to penicillin: Secondary | ICD-10-CM

## 2017-02-24 DIAGNOSIS — I878 Other specified disorders of veins: Secondary | ICD-10-CM | POA: Diagnosis present

## 2017-02-24 DIAGNOSIS — R404 Transient alteration of awareness: Secondary | ICD-10-CM | POA: Diagnosis not present

## 2017-02-24 DIAGNOSIS — Y838 Other surgical procedures as the cause of abnormal reaction of the patient, or of later complication, without mention of misadventure at the time of the procedure: Secondary | ICD-10-CM | POA: Diagnosis present

## 2017-02-24 DIAGNOSIS — T84030A Mechanical loosening of internal right hip prosthetic joint, initial encounter: Secondary | ICD-10-CM | POA: Diagnosis present

## 2017-02-24 DIAGNOSIS — I872 Venous insufficiency (chronic) (peripheral): Secondary | ICD-10-CM | POA: Diagnosis present

## 2017-02-24 DIAGNOSIS — Z96641 Presence of right artificial hip joint: Secondary | ICD-10-CM | POA: Diagnosis not present

## 2017-02-24 DIAGNOSIS — S0101XA Laceration without foreign body of scalp, initial encounter: Secondary | ICD-10-CM | POA: Diagnosis present

## 2017-02-24 DIAGNOSIS — M25559 Pain in unspecified hip: Secondary | ICD-10-CM | POA: Diagnosis not present

## 2017-02-24 DIAGNOSIS — M25551 Pain in right hip: Secondary | ICD-10-CM

## 2017-02-24 DIAGNOSIS — N390 Urinary tract infection, site not specified: Secondary | ICD-10-CM | POA: Diagnosis not present

## 2017-02-24 DIAGNOSIS — Z79899 Other long term (current) drug therapy: Secondary | ICD-10-CM

## 2017-02-24 DIAGNOSIS — W19XXXA Unspecified fall, initial encounter: Secondary | ICD-10-CM | POA: Diagnosis not present

## 2017-02-24 DIAGNOSIS — I10 Essential (primary) hypertension: Secondary | ICD-10-CM | POA: Diagnosis not present

## 2017-02-24 DIAGNOSIS — R531 Weakness: Secondary | ICD-10-CM

## 2017-02-24 DIAGNOSIS — E871 Hypo-osmolality and hyponatremia: Secondary | ICD-10-CM | POA: Diagnosis present

## 2017-02-24 DIAGNOSIS — E8770 Fluid overload, unspecified: Secondary | ICD-10-CM | POA: Diagnosis present

## 2017-02-24 DIAGNOSIS — S79911A Unspecified injury of right hip, initial encounter: Secondary | ICD-10-CM | POA: Diagnosis not present

## 2017-02-24 DIAGNOSIS — Z87891 Personal history of nicotine dependence: Secondary | ICD-10-CM

## 2017-02-24 DIAGNOSIS — R296 Repeated falls: Secondary | ICD-10-CM | POA: Diagnosis present

## 2017-02-24 DIAGNOSIS — M25561 Pain in right knee: Secondary | ICD-10-CM | POA: Diagnosis not present

## 2017-02-24 LAB — URINALYSIS, ROUTINE W REFLEX MICROSCOPIC
Bilirubin Urine: NEGATIVE
GLUCOSE, UA: NEGATIVE mg/dL
Hgb urine dipstick: NEGATIVE
Ketones, ur: 5 mg/dL — AB
Nitrite: NEGATIVE
PH: 7 (ref 5.0–8.0)
Protein, ur: NEGATIVE mg/dL
SPECIFIC GRAVITY, URINE: 1.01 (ref 1.005–1.030)
SQUAMOUS EPITHELIAL / LPF: NONE SEEN

## 2017-02-24 LAB — CBC WITH DIFFERENTIAL/PLATELET
Basophils Absolute: 0.1 10*3/uL (ref 0.0–0.1)
Basophils Relative: 1 %
EOS ABS: 0.1 10*3/uL (ref 0.0–0.7)
Eosinophils Relative: 1 %
HCT: 35.2 % — ABNORMAL LOW (ref 39.0–52.0)
Hemoglobin: 12.3 g/dL — ABNORMAL LOW (ref 13.0–17.0)
Lymphocytes Relative: 15 %
Lymphs Abs: 0.9 10*3/uL (ref 0.7–4.0)
MCH: 30.5 pg (ref 26.0–34.0)
MCHC: 34.9 g/dL (ref 30.0–36.0)
MCV: 87.3 fL (ref 78.0–100.0)
MONOS PCT: 19 %
Monocytes Absolute: 1.2 10*3/uL — ABNORMAL HIGH (ref 0.1–1.0)
NEUTROS ABS: 3.9 10*3/uL (ref 1.7–7.7)
NEUTROS PCT: 64 %
Platelets: 184 10*3/uL (ref 150–400)
RBC: 4.03 MIL/uL — ABNORMAL LOW (ref 4.22–5.81)
RDW: 13.8 % (ref 11.5–15.5)
WBC: 6.2 10*3/uL (ref 4.0–10.5)

## 2017-02-24 LAB — COMPREHENSIVE METABOLIC PANEL
ALK PHOS: 61 U/L (ref 38–126)
ALT: 19 U/L (ref 17–63)
AST: 30 U/L (ref 15–41)
Albumin: 3.3 g/dL — ABNORMAL LOW (ref 3.5–5.0)
Anion gap: 6 (ref 5–15)
BILIRUBIN TOTAL: 1.3 mg/dL — AB (ref 0.3–1.2)
BUN: 11 mg/dL (ref 6–20)
CALCIUM: 8.6 mg/dL — AB (ref 8.9–10.3)
CO2: 27 mmol/L (ref 22–32)
CREATININE: 0.59 mg/dL — AB (ref 0.61–1.24)
Chloride: 98 mmol/L — ABNORMAL LOW (ref 101–111)
GFR calc Af Amer: >60 mL/min (ref >60)
Glucose, Bld: 110 mg/dL — ABNORMAL HIGH (ref 65–99)
Potassium: 4.5 mmol/L (ref 3.5–5.1)
Sodium: 131 mmol/L — ABNORMAL LOW (ref 135–145)
TOTAL PROTEIN: 6.7 g/dL (ref 6.5–8.1)

## 2017-02-24 LAB — CK: Total CK: 35 U/L — ABNORMAL LOW (ref 49–397)

## 2017-02-24 MED ORDER — ACETAMINOPHEN 650 MG RE SUPP
650.0000 mg | Freq: Four times a day (QID) | RECTAL | Status: DC | PRN
Start: 2017-02-24 — End: 2017-03-01

## 2017-02-24 MED ORDER — FUROSEMIDE 20 MG PO TABS
20.0000 mg | ORAL_TABLET | Freq: Every day | ORAL | Status: DC
  Administered 2017-02-24: 20 mg via ORAL
  Filled 2017-02-24: qty 1

## 2017-02-24 MED ORDER — ENSURE ENLIVE PO LIQD
237.0000 mL | Freq: Two times a day (BID) | ORAL | Status: DC
  Administered 2017-02-25 – 2017-03-01 (×7): 237 mL via ORAL

## 2017-02-24 MED ORDER — SODIUM CHLORIDE 0.9 % IV BOLUS (SEPSIS)
1000.0000 mL | Freq: Once | INTRAVENOUS | Status: AC
  Administered 2017-02-24: 1000 mL via INTRAVENOUS

## 2017-02-24 MED ORDER — PROPYLENE GLYCOL 0.6 % OP SOLN: 1.0000 [drp] | Freq: Two times a day (BID) | OPHTHALMIC | Status: DC

## 2017-02-24 MED ORDER — ZOLPIDEM TARTRATE 5 MG PO TABS: 5.0000 mg | ORAL_TABLET | Freq: Every evening | ORAL | Status: DC | PRN

## 2017-02-24 MED ORDER — ENOXAPARIN SODIUM 40 MG/0.4ML ~~LOC~~ SOLN
40.0000 mg | SUBCUTANEOUS | Status: DC
  Administered 2017-02-24 – 2017-02-28 (×5): 40 mg via SUBCUTANEOUS
  Filled 2017-02-24 (×5): qty 0.4

## 2017-02-24 MED ORDER — SODIUM CHLORIDE 0.9 % IV SOLN
250.0000 mL | INTRAVENOUS | Status: DC | PRN
  Administered 2017-02-24: 250 mL via INTRAVENOUS

## 2017-02-24 MED ORDER — ONDANSETRON HCL 4 MG/2ML IJ SOLN: 4.0000 mg | Freq: Four times a day (QID) | INTRAMUSCULAR | Status: DC | PRN

## 2017-02-24 MED ORDER — ONDANSETRON HCL 4 MG PO TABS
4.0000 mg | ORAL_TABLET | Freq: Four times a day (QID) | ORAL | Status: DC | PRN
Start: 2017-02-24 — End: 2017-03-01

## 2017-02-24 MED ORDER — DEXTROSE 5 % IV SOLN
2.0000 g | INTRAVENOUS | Status: DC
  Administered 2017-02-24 – 2017-02-28 (×5): 2 g via INTRAVENOUS
  Filled 2017-02-24 (×6): qty 2

## 2017-02-24 MED ORDER — SODIUM CHLORIDE 0.9% FLUSH: 3.0000 mL | INTRAVENOUS | Status: DC | PRN

## 2017-02-24 MED ORDER — POLYVINYL ALCOHOL 1.4 % OP SOLN
1.0000 [drp] | Freq: Two times a day (BID) | OPHTHALMIC | Status: DC
  Administered 2017-02-24 – 2017-03-01 (×10): 1 [drp] via OPHTHALMIC
  Filled 2017-02-24: qty 15

## 2017-02-24 MED ORDER — SODIUM CHLORIDE 0.9% FLUSH
3.0000 mL | Freq: Two times a day (BID) | INTRAVENOUS | Status: DC
  Administered 2017-02-24 – 2017-03-01 (×3): 3 mL via INTRAVENOUS

## 2017-02-24 MED ORDER — PROSIGHT PO TABS
1.0000 | ORAL_TABLET | Freq: Every day | ORAL | Status: DC
  Administered 2017-02-24 – 2017-03-01 (×6): 1 via ORAL
  Filled 2017-02-24 (×6): qty 1

## 2017-02-24 MED ORDER — ICAPS PO CAPS: ORAL_CAPSULE | Freq: Every day | ORAL | Status: DC

## 2017-02-24 MED ORDER — BENAZEPRIL HCL 10 MG PO TABS
10.0000 mg | ORAL_TABLET | Freq: Every day | ORAL | Status: DC
  Administered 2017-02-24 – 2017-02-27 (×4): 10 mg via ORAL
  Filled 2017-02-24 (×5): qty 1

## 2017-02-24 MED ORDER — SALINE SPRAY 0.65 % NA SOLN
1.0000 | NASAL | Status: DC | PRN
  Filled 2017-02-24: qty 44

## 2017-02-24 MED ORDER — ACETAMINOPHEN 325 MG PO TABS: 650.0000 mg | ORAL_TABLET | Freq: Four times a day (QID) | ORAL | Status: DC | PRN

## 2017-02-24 MED ORDER — HYDROCODONE-ACETAMINOPHEN 5-325 MG PO TABS: 1.0000 | ORAL_TABLET | ORAL | Status: DC | PRN

## 2017-02-24 NOTE — Progress Notes (Signed)
PT Cancellation Note  Patient Details Name: Mclane Arora MRN: [id number] DOB: 1935/10/11   Cancelled Treatment:    Reason Eval/Treat Not Completed: Medical issues which prohibited therapy--Imaging (+) loosening of femoral component of R THA. Orthopedic consult ordered. Will hold PT. Thanks.    Weston Anna, MPT Pager: [phone]

## 2017-02-24 NOTE — Consult Note (Signed)
ORTHOPAEDIC CONSULTATION  REQUESTING PHYSICIAN: Merton Border, MD  PCP:  Wenda Low, MD  Chief Complaint: Right thigh pain, history of frequent falls.  HPI: David Key is a 81 y.o. male who complains of increasing right thigh pain as well as frequent falls over the last couple of months. He states some time back around the beginning of this year he was noted to have balance issues and developed a shuffling gait. He is had increasing right thigh pain as well over the same interval. He has had multiple falls over that same interval. Of late he has had decreased tolerance for ambulation and presented to the ER today for all of these issues. He does live alone and up until this point and over the last few months have been independent with all ADLs. He typically ambulance with the aid of an anterior walker.  Past medical history is, located by hypertension and right hip surgery with multiple revisions. Dr. Wynelle Link has been his orthopedic surgeon throughout the course of those surgeries.  On presentation to the ER he was found to have some signs on radiographs concerning for loosening of the right femoral stem. He's been admitted for generalized weakness and inability to care for himself independently. We were asked for consultation for recommendations on the right hip and femur. He currently denies any chest pain shortness of breath nausea or vomiting fevers night sweats or chills.   Past Medical History:  Diagnosis Date  . Arthritis   . Hypertension    Past Surgical History:  Procedure Laterality Date  . HIP SURGERY     Social History   Social History  . Marital status: Widowed    Spouse name: N/A  . Number of children: N/A  . Years of education: N/A   Social History Main Topics  . Smoking status: Former Research scientist (life sciences)  . Smokeless tobacco: Never Used  . Alcohol use Yes     Comment: daily  . Drug use: No  . Sexual activity: Not Asked   Other Topics Concern  . None   Social  History Narrative  . None   History reviewed. No pertinent family history. Allergies  Allergen Reactions  . Penicillins Hives    Has patient had a PCN reaction causing immediate rash, facial/tongue/throat swelling, SOB or lightheadedness with hypotension: Yes Has patient had a PCN reaction causing severe rash involving mucus membranes or skin necrosis: Yes Has patient had a PCN reaction that required hospitalization: Yes, was already in hospital when reaction happened Has patient had a PCN reaction occurring within the last 10 years: No If all of the above answers are "NO", then may proceed with Cephalosporin use.    Prior to Admission medications   Medication Sig Start Date End Date Taking? Authorizing Provider  benazepril (LOTENSIN) 10 MG tablet Take 10 mg by mouth daily.   Yes [provider]  cholecalciferol (VITAMIN D) 1000 units tablet Take 2,000 Units by mouth daily.   Yes [provider]  furosemide (LASIX) 20 MG tablet Take 20 mg by mouth daily. 01/29/17  Yes [provider]  Multiple Vitamins-Minerals (ICAPS PO) Take 1 capsule by mouth daily.   Yes [provider]  Propylene Glycol (SYSTANE BALANCE) 0.6 % SOLN Place 1 drop into both eyes 2 (two) times daily.   Yes [provider]  sodium chloride (OCEAN) 0.65 % SOLN nasal spray Place 1 spray into both nostrils as needed for congestion.   Yes [provider]   Dg Knee  Complete 4 Views Right  Result Date: 02/24/2017 CLINICAL DATA:  Right knee pain since a fall 6 days ago. EXAM: RIGHT KNEE - COMPLETE 4+ VIEW COMPARISON:  08/20/2009 FINDINGS: No evidence of fracture, dislocation, or joint effusion. Tiny marginal osteophytes on the patella. Soft tissues are unremarkable. IMPRESSION: No acute abnormality.  No significant change since 2011. Electronically Signed   By: Lorriane Shire M.D.   On: 02/24/2017 11:08   Dg Hip Unilat W Or Wo Pelvis 2-3 Views Right  Result Date:  02/24/2017 CLINICAL DATA:  Bilateral hip pain, right greater than left since a fall 6 days ago. EXAM: DG HIP (WITH OR WITHOUT PELVIS) 2-3V RIGHT COMPARISON:  Radiographs dated 08/20/2009 FINDINGS: FINDINGS There is no apparent fracture or dislocation. There is new lucency around the distal tip of the right femoral component with slight migration of the tip laterally. The left femoral component demonstrates no evidence of loosening. The acetabular components are unchanged since the prior study of 2011. No visible fractures.  Radioactive seeds in the prostate gland. IMPRESSION: 1. No fractures. 2. 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 Electronically Signed   By: Lorriane Shire M.D.   On: 02/24/2017 11:06    Positive ROS: All other systems have been reviewed and were otherwise negative with the exception of those mentioned in the HPI and as above.  Physical Exam: General: Alert, no acute distress Cardiovascular: No pedal edema Respiratory: No cyanosis, no use of accessory musculature GI: No organomegaly, abdomen is soft and non-tender Skin: No lesions in the area of chief complaint Neurologic: Sensation intact distally Psychiatric: Patient is competent for consent with normal mood and affect Lymphatic: No axillary or cervical lymphadenopathy  MUSCULOSKELETAL:  Right leg exam:  Negative pain in the groin with logroll on Stinchfield maneuver he has right lateral thigh pain. He does have some soreness to palpation along the lateral thigh as well. Nontender at the greater trochanter or ASIS. Globally weak on the right and left leg however does have motor intact distally with tib ant/gastrocsoleus/flexor hallucis longus and extensor hallucis longus. He does endorse sensation intact to light touch as well as deep and superficial peroneal nerve, sural and saphenous nerves, and tibial nerve. 1+ dorsalis pedis pulse. Of note he has some woody induration  concerning for chronic venous stasis in a stocking distribution of bilateral lower extremities with 1+ edema up to about the mid tibia.  Assessment: Femoral loosening right hip.   Plan: - I reviewed the radiographs of his right femur which do show some concerning signs of femoral stem loosening. I do not appreciate any periprostatic fracture at this time. Is a very complicated case and that he's had multiple surgeries and currently has a cemented stem with history of recent bone allograft augmentation. A subsequent surgery would be a major undertaking in someone of his age and diminishing physiologic status.  - At this time on a recommend 50% weightbearing to the right lower extremity with a walker at all times in a physical therapy occupational therapy evaluation while in the hospital.  - We'll also recheck out to Dr. Wynelle Link for any recommendations he may have during this hospitalization.  Ultimately I think if he can get through this issue with no further surgery it would be in his favor however we can't get him rehabilitated and independent again our hand may be forced. At this time this does not appear to be an infectious processes as white blood cell  count and vital signs appear to all be normal.  - Okay for a full diet from my perspective, would recommend subcutaneous heparin for DVT prophylaxis while in the hospital, along with SCDs.  Nicholes Stairs, MD Cell [phone]    02/24/2017 5:47 PM

## 2017-02-24 NOTE — Clinical Social Work Note (Signed)
Clinical Social Work Assessment  Patient Details  Name: David Key MRN: [id number] Date of Birth: [birth year]  Date of referral:  02/24/17               Reason for consult:  Facility Placement                Permission sought to share information with:    Permission granted to share information::  No  Name::        Agency::     Relationship::     Contact Information:     Housing/Transportation Living arrangements for the past 2 months:  Single Family Home Source of Information:  Patient Patient Interpreter Needed:  None Criminal Activity/Legal Involvement Pertinent to Current Situation/Hospitalization:  No - Comment as needed Significant Relationships:  Other Family Members, Neighbor Lives with:  Self Do you feel safe going back to the place where you live?  Yes Need for family participation in patient care:  No (Coment)  Care giving concerns:  Patient resides alone and has been unable to get up. Patient reported that the last time he was able to walk was last Tuesday and that he has not been able to eat in several days. Patient reported that he has both a wheelchair and walker at home.    Social Worker assessment / plan:  CSW spoke with patient at bedside, patient alert and oriented x 4. CSW and patient discussed patient's living arrangements. Patient reported that he resides alone and that his sister-in-laws come over twice a week to assist with laundry, shopping and finances. Patient reported that his sister-in-law is coming into town tomorrow to assist him with arranging additional assistance in the home. Patient reported that he is not interested in SNF and that he wants to return home at discharge. Patient reported that he is agreeable to home health and also able to pay for a private duty care agency to come into the home and provide more help. Patient reported that he just wants to find out what is wrong with his legs and regain independence at home. CSW provided patient with  private duty care agency list and notified ED RNCM that patient is interested in home health. CSW signing off, no other needs identified at this time. Patient not interested in SNF and wants home health. Patient is being admitted under observation status and will have to private pay for SNF.   Employment status:  Retired Forensic scientist:  Medicare PT Recommendations:  Not assessed at this time [place] / Referral to community resources:  Other (Comment Required)  Patient/Family's Response to care:  Patient thanked CSW for assistance and providing resources. Patient reported he is not interested in SNF.   Patient/Family's Understanding of and Emotional Response to Diagnosis, Current Treatment, and Prognosis:  Patient reported that he wants to find out what is going on with his legs and why he is no longer able to get up. Patient being admitted under observation status. Patient reported that he plans to discharge home with home health. Patient reported that he is not willing to consider any other discharge plans at this time other than returning home.   Emotional Assessment Appearance:  Appears stated age Attitude/Demeanor/Rapport:    Affect (typically observed):  Calm, Hopeful Orientation:  Oriented to Self, Oriented to Place, Oriented to  Time, Oriented to Situation Alcohol / Substance use:  Not Applicable Psych involvement (Current and /or in the community):  No (Comment)  Discharge Needs  Concerns to be  addressed:  Care Coordination Readmission within the last 30 days:  No Current discharge risk:  Lives alone Barriers to Discharge:  Continued Medical Work up   [employer], [place] 02/24/2017, 1:59 PM

## 2017-02-24 NOTE — H&P (Signed)
[facility]                                                                                    Patient Demographics  David Key, is a 81 y.o. male  CSN: [id number]  MRN: [id number]  DOB - Oct 02, 1935  Admit Date - 02/24/2017  Outpatient Primary MD for the patient is Wenda Low, MD   With History of -  Past Medical History:  Diagnosis Date  . Arthritis   . Hypertension       Past Surgical History:  Procedure Laterality Date  . HIP SURGERY      in for   Chief Complaint  Patient presents with  . Fall    placement     HPI  David Key  is a 81 y.o. male, With past medical history significant for hypertension and arthritis status post surgery for his right hip around 28 years ago and status post fall with scalp laceration around 6 days ago, presenting for weakness and inability to get around at home. Patient denies any chest pains, shortness of breath, nausea vomiting or diarrhea "He is too weak"and not able to take care of himself anymore. His sister is a help at home. Right hip x-ray showed mild loosening and Dr. Stann Mainland will see patient for further management     Review of Systems    In addition to the HPI above,  No Fever-chills,Mild  Headache, No changes with Vision or hearing, No problems swallowing food or Liquids, No Chest pain, Cough or Shortness of Breath, No Abdominal pain, No Nausea or Vommitting, Bowel movements are regular, No Blood in stool or Urine, No dysuria, No new skin rashes or bruises, No new weakness, tingling, numbness in any extremity, No recent weight gain or loss, No polyuria, polydypsia or polyphagia,   A full 10 point Review of Systems was done, except as stated above, all other Review of Systems were negative.   Social History Social History  Substance Use Topics  . Smoking status: Former Research scientist (life sciences)  . Smokeless tobacco: Never Used  . Alcohol use Yes     Comment: daily     Family History History reviewed.  No pertinent family history.   Prior to Admission medications   Medication Sig Start Date End Date Taking? Authorizing Provider  benazepril (LOTENSIN) 10 MG tablet Take 10 mg by mouth daily.   Yes [provider]  cholecalciferol (VITAMIN D) 1000 units tablet Take 2,000 Units by mouth daily.   Yes [provider]  furosemide (LASIX) 20 MG tablet Take 20 mg by mouth daily. 01/29/17  Yes [provider]  Multiple Vitamins-Minerals (ICAPS PO) Take 1 capsule by mouth daily.   Yes [provider]  Propylene Glycol (SYSTANE BALANCE) 0.6 % SOLN Place 1 drop into both eyes 2 (two) times daily.   Yes [provider]  sodium chloride (OCEAN) 0.65 % SOLN nasal spray Place 1 spray into both nostrils as needed for congestion.   Yes [provider]    Allergies  Allergen Reactions  . Penicillins Hives    Has patient had a PCN reaction causing immediate rash, facial/tongue/throat swelling, SOB or  lightheadedness with hypotension: Yes Has patient had a PCN reaction causing severe rash involving mucus membranes or skin necrosis: Yes Has patient had a PCN reaction that required hospitalization: Yes, was already in hospital when reaction happened Has patient had a PCN reaction occurring within the last 10 years: No If all of the above answers are "NO", then may proceed with Cephalosporin use.     Physical Exam  Vitals  Blood pressure (!) 154/89, pulse 98, temperature 98 F (36.7 C), temperature source Oral, resp. rate 18, height 5\' 8"  (1.727 m), weight 104.3 kg (230 lb), SpO2 96 %.   1. GeneralElderly gentleman, well-developed looks weak   2. Normal affect and insight, Not Suicidal or Homicidal, Awake Alert, Oriented X 3.  3. No F.N deficits,patient moving all extremities.  4. Ears and Eyes appear Normal, Conjunctivae clear, PERRLA. Moist Oral Mucosa.  5. Supple Neck, No JVD, No cervical lymphadenopathy appriciated, No Carotid Bruits.  6.  Symmetrical Chest wall movement, Good air movement bilaterally, CTAB.  7. RRR, No Gallops, Rubs or Murmurs, No Parasternal Heave.  8. Positive Bowel Sounds, Abdomen Soft, Non tender, No organomegaly appriciated,No rebound -guarding or rigidity.  9.  No Cyanosis, Normal Skin Turgor, No Skin Rash or Bruise.  10. Good muscle tone,  joints appear normal  scar of right hip surgery well-healed, no effusions, Normal ROM.  11. No Palpable Lymph Nodes in Neck or Axillae    Data Review  CBC  Recent Labs Lab 02/24/17 1119  WBC 6.2  HGB 12.3*  HCT 35.2*  PLT 184  MCV 87.3  MCH 30.5  MCHC 34.9  RDW 13.8  LYMPHSABS 0.9  MONOABS 1.2*  EOSABS 0.1  BASOSABS 0.1   ------------------------------------------------------------------------------------------------------------------  Chemistries   Recent Labs Lab 02/24/17 1119  NA 131*  K 4.5  CL 98*  CO2 27  GLUCOSE 110*  BUN 11  CREATININE 0.59*  CALCIUM 8.6*  AST 30  ALT 19  ALKPHOS 61  BILITOT 1.3*   ------------------------------------------------------------------------------------------------------------------ estimated creatinine clearance is 89.1 mL/min (A) (by C-G formula based on SCr of 0.59 mg/dL (L)). ------------------------------------------------------------------------------------------------------------------ No results for input(s): TSH, T4TOTAL, T3FREE, THYROIDAB in the last 72 hours.  Invalid input(s): FREET3   Coagulation profile No results for input(s): INR, PROTIME in the last 168 hours. ------------------------------------------------------------------------------------------------------------------- No results for input(s): DDIMER in the last 72 hours. -------------------------------------------------------------------------------------------------------------------  Cardiac Enzymes No results for input(s): CKMB, TROPONINI, MYOGLOBIN in the last 168 hours.  Invalid input(s):  CK ------------------------------------------------------------------------------------------------------------------ Invalid input(s): POCBNP   ---------------------------------------------------------------------------------------------------------------  Urinalysis    Component Value Date/Time   COLORURINE YELLOW 02/24/2017 1218   APPEARANCEUR HAZY (A) 02/24/2017 1218   LABSPEC 1.010 02/24/2017 1218   PHURINE 7.0 02/24/2017 [place] 02/24/2017 [place] 02/24/2017 [place] 02/24/2017 1218   KETONESUR 5 (A) 02/24/2017 1218   PROTEINUR NEGATIVE 02/24/2017 1218   UROBILINOGEN 1.0 12/05/2013 1127   NITRITE NEGATIVE 02/24/2017 1218   LEUKOCYTESUR TRACE (A) 02/24/2017 1218    ----------------------------------------------------------------------------------------------------------------     Imaging results:   Ct Head Wo Contrast  Result Date: 02/19/2017 CLINICAL DATA:  Status post fall, with laceration at the right vertex and chronic neck pain. Initial encounter. EXAM: CT HEAD WITHOUT CONTRAST CT CERVICAL SPINE WITHOUT CONTRAST TECHNIQUE: Multidetector CT imaging of the head and cervical spine was performed following the standard protocol without intravenous contrast. Multiplanar CT image reconstructions of the cervical spine were also generated. COMPARISON:  MRI of the cervical spine  performed 02/25/2007 FINDINGS: CT HEAD FINDINGS Brain: No evidence of acute infarction, hemorrhage, hydrocephalus, extra-axial collection or mass lesion/mass effect. Prominence of the ventricles and sulci reflects moderate cortical volume loss. Cerebellar atrophy is noted. Scattered periventricular white matter change likely reflects small vessel ischemic microangiopathy. The brainstem and fourth ventricle are within normal limits. The basal ganglia are unremarkable in appearance. The cerebral hemispheres demonstrate grossly normal gray-white differentiation.  No mass effect or midline shift is seen. Vascular: No hyperdense vessel or unexpected calcification. Skull: There is no evidence of fracture; visualized osseous structures are unremarkable in appearance. Sinuses/Orbits: The orbits are within normal limits. The paranasal sinuses and mastoid air cells are well-aerated. Other: A soft tissue laceration is noted at the right vertex. CT CERVICAL SPINE FINDINGS Alignment: Normal. Skull base and vertebrae: No acute fracture. No primary bone lesion or focal pathologic process. Soft tissues and spinal canal: No prevertebral fluid or swelling. No visible canal hematoma. Disc levels: Anterior bridging osteophytes are noted along the cervical spine. Upper chest: Calcification is noted at the carotid bifurcations bilaterally. The minimally visualized lung apices are clear. Other: No additional soft tissue abnormalities are seen. IMPRESSION: 1. No evidence of traumatic intracranial injury or fracture. 2. No evidence of acute fracture or subluxation along the cervical spine. 3. Soft tissue laceration at the right vertex. 4. Moderate cortical volume loss and scattered small vessel ischemic microangiopathy. 5. Calcification at the carotid bifurcations bilaterally. Carotid ultrasound would be helpful for further evaluation, when and as deemed clinically appropriate. 6. Diffuse anterior bridging osteophytes along the cervical spine. Electronically Signed   By: Garald Balding M.D.   On: 02/19/2017 03:21   Ct Cervical Spine Wo Contrast  Result Date: 02/19/2017 CLINICAL DATA:  Status post fall, with laceration at the right vertex and chronic neck pain. Initial encounter. EXAM: CT HEAD WITHOUT CONTRAST CT CERVICAL SPINE WITHOUT CONTRAST TECHNIQUE: Multidetector CT imaging of the head and cervical spine was performed following the standard protocol without intravenous contrast. Multiplanar CT image reconstructions of the cervical spine were also generated. COMPARISON:  MRI of the  cervical spine performed 02/25/2007 FINDINGS: CT HEAD FINDINGS Brain: No evidence of acute infarction, hemorrhage, hydrocephalus, extra-axial collection or mass lesion/mass effect. Prominence of the ventricles and sulci reflects moderate cortical volume loss. Cerebellar atrophy is noted. Scattered periventricular white matter change likely reflects small vessel ischemic microangiopathy. The brainstem and fourth ventricle are within normal limits. The basal ganglia are unremarkable in appearance. The cerebral hemispheres demonstrate grossly normal gray-white differentiation. No mass effect or midline shift is seen. Vascular: No hyperdense vessel or unexpected calcification. Skull: There is no evidence of fracture; visualized osseous structures are unremarkable in appearance. Sinuses/Orbits: The orbits are within normal limits. The paranasal sinuses and mastoid air cells are well-aerated. Other: A soft tissue laceration is noted at the right vertex. CT CERVICAL SPINE FINDINGS Alignment: Normal. Skull base and vertebrae: No acute fracture. No primary bone lesion or focal pathologic process. Soft tissues and spinal canal: No prevertebral fluid or swelling. No visible canal hematoma. Disc levels: Anterior bridging osteophytes are noted along the cervical spine. Upper chest: Calcification is noted at the carotid bifurcations bilaterally. The minimally visualized lung apices are clear. Other: No additional soft tissue abnormalities are seen. IMPRESSION: 1. No evidence of traumatic intracranial injury or fracture. 2. No evidence of acute fracture or subluxation along the cervical spine. 3. Soft tissue laceration at the right vertex. 4. Moderate cortical volume loss and scattered small vessel ischemic microangiopathy. 5. Calcification  at the carotid bifurcations bilaterally. Carotid ultrasound would be helpful for further evaluation, when and as deemed clinically appropriate. 6. Diffuse anterior bridging osteophytes along  the cervical spine. Electronically Signed   By: Garald Balding M.D.   On: 02/19/2017 03:21   Dg Knee Complete 4 Views Right  Result Date: 02/24/2017 CLINICAL DATA:  Right knee pain since a fall 6 days ago. EXAM: RIGHT KNEE - COMPLETE 4+ VIEW COMPARISON:  08/20/2009 FINDINGS: No evidence of fracture, dislocation, or joint effusion. Tiny marginal osteophytes on the patella. Soft tissues are unremarkable. IMPRESSION: No acute abnormality.  No significant change since 2011. Electronically Signed   By: Lorriane Shire M.D.   On: 02/24/2017 11:08   Dg Hip Unilat W Or Wo Pelvis 2-3 Views Right  Result Date: 02/24/2017 CLINICAL DATA:  Bilateral hip pain, right greater than left since a fall 6 days ago. EXAM: DG HIP (WITH OR WITHOUT PELVIS) 2-3V RIGHT COMPARISON:  Radiographs dated 08/20/2009 FINDINGS: FINDINGS There is no apparent fracture or dislocation. There is new lucency around the distal tip of the right femoral component with slight migration of the tip laterally. The left femoral component demonstrates no evidence of loosening. The acetabular components are unchanged since the prior study of 2011. No visible fractures.  Radioactive seeds in the prostate gland. IMPRESSION: 1. No fractures. 2. 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 Electronically Signed   By: Lorriane Shire M.D.   On: 02/24/2017 11:06    My personal review of EKG: Normal sinus rhythm at 90 bpm  Assessment & Plan  1. UTI 2. Generalized weakness 3. Right hip lucency(status post right hip replacement) 4. Status post fall with scalp laceration 6 days ago.  Plan  Admit to MedSurg IV antibiotics Status post IV fluids Consult Dr. Stann Mainland for right hip lucency. Dr. Stann Mainland partner did surgery on him. Dr. Stann Mainland aware. Social worker consult for home health or placement  DVT ProphylaxisLovenox   AM Labs Ordered, also please review Full Orders    Code Statusfull  Disposition  Plan:Home with home health  Time spent in minutes :38 minutes  Condition GUARDED   @SIGNATURE @

## 2017-02-24 NOTE — ED Notes (Signed)
Bed: DT91 Expected date:  Expected time:  Means of arrival:  Comments: EMS- recent fall, request for placement

## 2017-02-24 NOTE — ED Notes (Signed)
Patient given urinal at bedside. Aware of needing specimen.

## 2017-02-24 NOTE — Care Management Obs Status (Signed)
[facility] NOTIFICATION   Patient Details  Name: David Key MRN: [id number] Date of Birth: 17-Aug-1935   Medicare Observation Status Notification Given:  Yes    Corky Crafts, RN 02/24/2017, 1:59 PM

## 2017-02-24 NOTE — Care Management Note (Signed)
Case Management Note  Patient Details  Name: David Key MRN: [id number] Date of Birth: [birth year]  Subjective/Objective:  81 year old male with generalized weakness from home alone with fall in the last 6 weeks.  Pt reports he's having trouble getting around at home and that his sisters come help him two days a well. CM consulted for possible HHS needs.  CSW consulted for SNF placement.  Pt prefers staying at home if possible.             Action/Plan: Advised EDP to place a PT eval for SNF placement if needed.  CM spoke with pt who states he has used [facility] in the past and would like to use them again for RN, PT, OT, NA, and SW.  Pt does not have a specific second choice.  Pt initially requested [facility], but they are a private duty for CNA care only, no HHS.  CM will need to follow on inpt unit for D/C needs.   Expected Discharge Date:    Unknown              Expected Discharge Plan:  [place]  In-House Referral:  Clinical Social Work  Discharge planning Services  CM Consult  Post Acute Care Choice:  Home Health Choice offered to:  Patient  Status of Service:  In process, will continue to follow  Corky Crafts, RN 02/24/2017, 2:00 PM

## 2017-02-24 NOTE — ED Triage Notes (Signed)
Per EMS pt lives home alone, unable to take care of himself, unable to medicate self. Had fall 6 days ago for which he received staple in back right of head. Alert and oriented x 4. Seeking nursing home placement. Neighbors assisted pt when needed. No family  Near by.

## 2017-02-24 NOTE — ED Provider Notes (Signed)
[facility] DEPT Provider Note   CSN: [id number] Arrival date & time: 02/24/17  7262     History   Chief Complaint Chief Complaint  Patient presents with  . Fall    placement    HPI David Key is a 81 y.o. male.  HPI Patient presents with generalized weakness. States he's having more difficulty getting up to his right leg also. Has had multiple surgeries on his right hip. At baseline he walks with a walker. Had a fall around 6 days ago. Has been having difficulty getting out of bed since. States he has not eaten in 2-3 days since he cannot get up to get food. Has some family members help and states he called EMS to help get him out of bed at times. No fevers. States he got the name of someone that can help him states he does not really want to come in the hospital or get placed anywhere. Just wants to feel better so he can live at home.   Past Medical History:  Diagnosis Date  . Arthritis   . Hypertension     Patient Active Problem List   Diagnosis Date Noted  . Closed fracture of left humerus 12/05/2013  . Pre-syncope 12/05/2013  . Hyponatremia 12/05/2013  . Alcohol abuse 12/05/2013  . Bilateral leg edema 12/05/2013  . Chronic venous stasis dermatitis 12/05/2013  . HTN (hypertension) 12/05/2013  . UTI (urinary tract infection) 12/05/2013    Past Surgical History:  Procedure Laterality Date  . HIP SURGERY         Home Medications    Prior to Admission medications   Medication Sig Start Date End Date Taking? Authorizing Provider  benazepril (LOTENSIN) 10 MG tablet Take 10 mg by mouth daily.   Yes [provider]  cholecalciferol (VITAMIN D) 1000 units tablet Take 2,000 Units by mouth daily.   Yes [provider]  furosemide (LASIX) 20 MG tablet Take 20 mg by mouth daily. 01/29/17  Yes [provider]  Multiple Vitamins-Minerals (ICAPS PO) Take 1 capsule by mouth daily.   Yes [provider]  Propylene Glycol (SYSTANE  BALANCE) 0.6 % SOLN Place 1 drop into both eyes 2 (two) times daily.   Yes [provider]  sodium chloride (OCEAN) 0.65 % SOLN nasal spray Place 1 spray into both nostrils as needed for congestion.   Yes [provider]    Family History History reviewed. No pertinent family history.  Social History Social History  Substance Use Topics  . Smoking status: Former Research scientist (life sciences)  . Smokeless tobacco: Never Used  . Alcohol use Yes     Comment: daily     Allergies   Penicillins   Review of Systems Review of Systems  Constitutional: Positive for fatigue. Negative for appetite change and fever.  HENT: Negative for congestion.   Respiratory: Negative for shortness of breath.   Cardiovascular: Negative for chest pain.  Gastrointestinal: Negative for abdominal pain.  Genitourinary: Negative for frequency.  Musculoskeletal: Negative for back pain.  Neurological: Positive for weakness.  Hematological: Negative for adenopathy.  Psychiatric/Behavioral: Negative for confusion.     Physical Exam Updated Vital Signs BP (!) 160/93   Pulse 96   Temp 97.8 F (36.6 C) (Oral)   Resp 15   Ht 5\' 8"  (1.727 m)   Wt 104.3 kg (230 lb)   SpO2 94%   BMI 34.97 kg/m   Physical Exam   ED Treatments / Results  Labs (all labs ordered are listed,  but only abnormal results are displayed) Labs Reviewed  COMPREHENSIVE METABOLIC PANEL - Abnormal; Notable for the following:       Result Value   Sodium 131 (*)    Chloride 98 (*)    Glucose, Bld 110 (*)    Creatinine, Ser 0.59 (*)    Calcium 8.6 (*)    Albumin 3.3 (*)    Total Bilirubin 1.3 (*)    All other components within normal limits  URINALYSIS, ROUTINE W REFLEX MICROSCOPIC - Abnormal; Notable for the following:    APPearance HAZY (*)    Ketones, ur 5 (*)    Leukocytes, UA TRACE (*)    Bacteria, UA MANY (*)    All other components within normal limits  CBC WITH DIFFERENTIAL/PLATELET - Abnormal; Notable for the following:     RBC 4.03 (*)    Hemoglobin 12.3 (*)    HCT 35.2 (*)    Monocytes Absolute 1.2 (*)    All other components within normal limits  CK - Abnormal; Notable for the following:    Total CK 35 (*)    All other components within normal limits  URINE CULTURE    EKG  EKG Interpretation None       Radiology Dg Knee Complete 4 Views Right  Result Date: 02/24/2017 CLINICAL DATA:  Right knee pain since a fall 6 days ago. EXAM: RIGHT KNEE - COMPLETE 4+ VIEW COMPARISON:  08/20/2009 FINDINGS: No evidence of fracture, dislocation, or joint effusion. Tiny marginal osteophytes on the patella. Soft tissues are unremarkable. IMPRESSION: No acute abnormality.  No significant change since 2011. Electronically Signed   By: Lorriane Shire M.D.   On: 02/24/2017 11:08   Dg Hip Unilat W Or Wo Pelvis 2-3 Views Right  Result Date: 02/24/2017 CLINICAL DATA:  Bilateral hip pain, right greater than left since a fall 6 days ago. EXAM: DG HIP (WITH OR WITHOUT PELVIS) 2-3V RIGHT COMPARISON:  Radiographs dated 08/20/2009 FINDINGS: FINDINGS There is no apparent fracture or dislocation. There is new lucency around the distal tip of the right femoral component with slight migration of the tip laterally. The left femoral component demonstrates no evidence of loosening. The acetabular components are unchanged since the prior study of 2011. No visible fractures.  Radioactive seeds in the prostate gland. IMPRESSION: 1. No fractures. 2. 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 Electronically Signed   By: Lorriane Shire M.D.   On: 02/24/2017 11:06    Procedures Procedures (including critical care time)  Medications Ordered in ED Medications  sodium chloride 0.9 % bolus 1,000 mL (0 mLs Intravenous Stopped 02/24/17 1213)     Initial Impression / Assessment and Plan / ED Course  I have reviewed the triage vital signs and the nursing notes.  Pertinent labs & imaging results  that were available during my care of the patient were reviewed by me and considered in my medical decision making (see chart for details).     Patient presents with generalized weakness. Had a fall several days ago. Since then difficulty getting out of bed. Has been able to take care of himself. Has not really been able to eat and drink. Urine shows possible infection. X-ray shows possible loosening of the hardware on his right hip. Discussed with Dr. Stann Mainland. For that can be followed as an outpatient. However does not feel he will be able to manage at home at this point. I think with a possible UTI it  is reasonable for observation and further evaluation in the hospital.  Final Clinical Impressions(s) / ED Diagnoses   Final diagnoses:  Lower urinary tract infectious disease    New Prescriptions New Prescriptions   No medications on file     Davonna Belling, MD 02/24/17 1318

## 2017-02-24 NOTE — ED Notes (Signed)
ED TO INPATIENT HANDOFF REPORT  Name/Age/Gender David Key 81 y.o. male  Home/SNF/Other Home  Chief Complaint fall/weakness  Code Status History    Date Active Date Inactive Code Status Order ID Comments User Context   12/05/2013  8:44 AM 12/08/2013  6:24 PM Full Code [id number]  Caren Griffins, MD ED      Level of Care/Admitting Diagnosis ED Disposition    ED Disposition Condition Comment   Admit  Hospital Area: [facility] [[id number]]  Level of Care: Med-Surg [16]  Diagnosis: Urinary tract infection [962952]  Admitting Physician: Merton Border [[id number]]  Attending Physician: Laren Everts, ALI Marshal.Browner  PT Class (Do Not Modify): Observation [104]  PT Acc Code (Do Not Modify): Observation [10022]       Medical History Past Medical History:  Diagnosis Date  . Arthritis   . Hypertension     Allergies Allergies  Allergen Reactions  . Penicillins Hives    Has patient had a PCN reaction causing immediate rash, facial/tongue/throat swelling, SOB or lightheadedness with hypotension: Yes Has patient had a PCN reaction causing severe rash involving mucus membranes or skin necrosis: Yes Has patient had a PCN reaction that required hospitalization: Yes, was already in hospital when reaction happened Has patient had a PCN reaction occurring within the last 10 years: No If all of the above answers are "NO", then may proceed with Cephalosporin use.     IV Location/Drains/Wounds Patient Lines/Drains/Airways Status   Active Line/Drains/Airways    Name:   Placement date:   Placement time:   Site:   Days:   Peripheral IV 02/24/17 Left Hand  02/24/17    1102    Hand    less than 1          Labs/Imaging Results for orders placed or performed during the hospital encounter of 02/24/17 (from the past 48 hour(s))  Comprehensive metabolic panel     Status: Abnormal   Collection Time: 02/24/17 11:19 AM  Result Value Ref Range   Sodium 131 (L) 135 - 145 mmol/L   Potassium 4.5 3.5 - 5.1 mmol/L   Chloride 98 (L) 101 - 111 mmol/L   CO2 27 22 - 32 mmol/L   Glucose, Bld 110 (H) 65 - 99 mg/dL   BUN 11 6 - 20 mg/dL   Creatinine, Ser 0.59 (L) 0.61 - 1.24 mg/dL   Calcium 8.6 (L) 8.9 - 10.3 mg/dL   Total Protein 6.7 6.5 - 8.1 g/dL   Albumin 3.3 (L) 3.5 - 5.0 g/dL   AST 30 15 - 41 U/L   ALT 19 17 - 63 U/L   Alkaline Phosphatase 61 38 - 126 U/L   Total Bilirubin 1.3 (H) 0.3 - 1.2 mg/dL   GFR calc non Af Amer >60 >60 mL/min   GFR calc Af Amer >60 >60 mL/min    Comment: (NOTE) The eGFR has been calculated using the CKD EPI equation. This calculation has not been validated in all clinical situations. eGFR's persistently <60 mL/min signify possible Chronic Kidney Disease.    Anion gap 6 5 - 15  CBC with Differential     Status: Abnormal   Collection Time: 02/24/17 11:19 AM  Result Value Ref Range   WBC 6.2 4.0 - 10.5 K/uL   RBC 4.03 (L) 4.22 - 5.81 MIL/uL   Hemoglobin 12.3 (L) 13.0 - 17.0 g/dL   HCT 35.2 (L) 39.0 - 52.0 %   MCV 87.3 78.0 - 100.0 fL   MCH 30.5 26.0 -  34.0 pg   MCHC 34.9 30.0 - 36.0 g/dL   RDW 13.8 11.5 - 15.5 %   Platelets 184 150 - 400 K/uL   Neutrophils Relative % 64 %   Lymphocytes Relative 15 %   Monocytes Relative 19 %   Eosinophils Relative 1 %   Basophils Relative 1 %   Neutro Abs 3.9 1.7 - 7.7 K/uL   Lymphs Abs 0.9 0.7 - 4.0 K/uL   Monocytes Absolute 1.2 (H) 0.1 - 1.0 K/uL   Eosinophils Absolute 0.1 0.0 - 0.7 K/uL   Basophils Absolute 0.1 0.0 - 0.1 K/uL   WBC Morphology MILD LEFT SHIFT (1-5% METAS, OCC MYELO, OCC BANDS)   CK     Status: Abnormal   Collection Time: 02/24/17 11:19 AM  Result Value Ref Range   Total CK 35 (L) 49 - 397 U/L  Urinalysis, Routine w reflex microscopic     Status: Abnormal   Collection Time: 02/24/17 12:18 PM  Result Value Ref Range   Color, Urine YELLOW YELLOW   APPearance HAZY (A) CLEAR   Specific Gravity, Urine 1.010 1.005 - 1.030   pH 7.0 5.0 - 8.0   Glucose, UA NEGATIVE NEGATIVE  mg/dL   Hgb urine dipstick NEGATIVE NEGATIVE   Bilirubin Urine NEGATIVE NEGATIVE   Ketones, ur 5 (A) NEGATIVE mg/dL   Protein, ur NEGATIVE NEGATIVE mg/dL   Nitrite NEGATIVE NEGATIVE   Leukocytes, UA TRACE (A) NEGATIVE   RBC / HPF 0-5 0 - 5 RBC/hpf   WBC, UA 6-30 0 - 5 WBC/hpf   Bacteria, UA MANY (A) NONE SEEN   Squamous Epithelial / LPF NONE SEEN NONE SEEN   Mucous PRESENT    Dg Knee Complete 4 Views Right  Result Date: 02/24/2017 CLINICAL DATA:  Right knee pain since a fall 6 days ago. EXAM: RIGHT KNEE - COMPLETE 4+ VIEW COMPARISON:  08/20/2009 FINDINGS: No evidence of fracture, dislocation, or joint effusion. Tiny marginal osteophytes on the patella. Soft tissues are unremarkable. IMPRESSION: No acute abnormality.  No significant change since 2011. Electronically Signed   By: Lorriane Shire M.D.   On: 02/24/2017 11:08   Dg Hip Unilat W Or Wo Pelvis 2-3 Views Right  Result Date: 02/24/2017 CLINICAL DATA:  Bilateral hip pain, right greater than left since a fall 6 days ago. EXAM: DG HIP (WITH OR WITHOUT PELVIS) 2-3V RIGHT COMPARISON:  Radiographs dated 08/20/2009 FINDINGS: FINDINGS There is no apparent fracture or dislocation. There is new lucency around the distal tip of the right femoral component with slight migration of the tip laterally. The left femoral component demonstrates no evidence of loosening. The acetabular components are unchanged since the prior study of 2011. No visible fractures.  Radioactive seeds in the prostate gland. IMPRESSION: 1. No fractures. 2. 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 Electronically Signed   By: Lorriane Shire M.D.   On: 02/24/2017 11:06    Pending Labs Unresulted Labs    Start     Ordered   02/24/17 1316  Urine culture  STAT,   STAT     02/24/17 1315   Signed and Held  CBC  (enoxaparin (LOVENOX)    CrCl >/= 30 ml/min)  Once,   R    Comments:  Baseline for enoxaparin therapy IF NOT ALREADY DRAWN.   Notify MD if PLT < 100 K.    Signed and Held   Signed and Held  Creatinine, serum  (enoxaparin (LOVENOX)  CrCl >/= 30 ml/min)  Once,   R    Comments:  Baseline for enoxaparin therapy IF NOT ALREADY DRAWN.    Signed and Held   Signed and Held  Creatinine, serum  (enoxaparin (LOVENOX)    CrCl >/= 30 ml/min)  Weekly,   R    Comments:  while on enoxaparin therapy    Signed and Held   Signed and Held  Basic metabolic panel  Tomorrow morning,   R     Signed and Held   Signed and Held  CBC  Tomorrow morning,   R     Signed and Held      Isolation Precautions No active isolations  Vitals/Pain Today's Vitals   02/24/17 1230 02/24/17 1300 02/24/17 1306 02/24/17 1330  BP: (!) 149/89 (!) 160/93  (!) 160/96  Pulse: 96 96  96  Resp: (!) 22 15  (!) 27  Temp:   97.8 F (36.6 C)   TempSrc:   Oral   SpO2: 94% 94%  96%  Weight:      Height:      PainSc:        Medications Medications  sodium chloride 0.9 % bolus 1,000 mL (0 mLs Intravenous Stopped 02/24/17 1213)    Mobility non-ambulatory

## 2017-02-24 NOTE — Progress Notes (Signed)
Pharmacy Antibiotic Note  David Key is a 81 y.o. male admitted on 02/24/2017 with UTI.  Pharmacy has been consulted for ceftriaxone dosing.  Plan: Ceftriaxone 2gm IV q24h Pharmacy will sign off as no adjustment needed in renal dysfunction  Height: 5\' 8"  (172.7 cm) Weight: 230 lb (104.3 kg) IBW/kg (Calculated) : 68.4  Temp (24hrs), Avg:97.9 F (36.6 C), Min:97.8 F (36.6 C), Max:98 F (36.7 C)   Recent Labs Lab 02/24/17 1119  WBC 6.2  CREATININE 0.59*    Estimated Creatinine Clearance: 89.1 mL/min (A) (by C-G formula based on SCr of 0.59 mg/dL (L)).    Allergies  Allergen Reactions  . Penicillins Hives    Has patient had a PCN reaction causing immediate rash, facial/tongue/throat swelling, SOB or lightheadedness with hypotension: Yes Has patient had a PCN reaction causing severe rash involving mucus membranes or skin necrosis: Yes Has patient had a PCN reaction that required hospitalization: Yes, was already in hospital when reaction happened Has patient had a PCN reaction occurring within the last 10 years: No If all of the above answers are "NO", then may proceed with Cephalosporin use.     Antimicrobials this admission: 8/13 ceftriaxone >>  Microbiology results: 8/13 UCx: sent    Thank you for allowing pharmacy to be a part of this patient's care.  Dolly Rias RPh 02/24/2017, 3:20 PM Pager [phone]

## 2017-02-25 DIAGNOSIS — R52 Pain, unspecified: Secondary | ICD-10-CM | POA: Diagnosis not present

## 2017-02-25 DIAGNOSIS — S0101XA Laceration without foreign body of scalp, initial encounter: Secondary | ICD-10-CM | POA: Diagnosis present

## 2017-02-25 DIAGNOSIS — J3089 Other allergic rhinitis: Secondary | ICD-10-CM | POA: Diagnosis not present

## 2017-02-25 DIAGNOSIS — I878 Other specified disorders of veins: Secondary | ICD-10-CM | POA: Diagnosis present

## 2017-02-25 DIAGNOSIS — R21 Rash and other nonspecific skin eruption: Secondary | ICD-10-CM | POA: Diagnosis not present

## 2017-02-25 DIAGNOSIS — Z9181 History of falling: Secondary | ICD-10-CM | POA: Diagnosis not present

## 2017-02-25 DIAGNOSIS — N390 Urinary tract infection, site not specified: Secondary | ICD-10-CM | POA: Diagnosis not present

## 2017-02-25 DIAGNOSIS — I10 Essential (primary) hypertension: Secondary | ICD-10-CM | POA: Diagnosis not present

## 2017-02-25 DIAGNOSIS — W19XXXA Unspecified fall, initial encounter: Secondary | ICD-10-CM | POA: Diagnosis present

## 2017-02-25 DIAGNOSIS — R0981 Nasal congestion: Secondary | ICD-10-CM | POA: Diagnosis not present

## 2017-02-25 DIAGNOSIS — E860 Dehydration: Secondary | ICD-10-CM | POA: Diagnosis not present

## 2017-02-25 DIAGNOSIS — E569 Vitamin deficiency, unspecified: Secondary | ICD-10-CM | POA: Diagnosis not present

## 2017-02-25 DIAGNOSIS — M25551 Pain in right hip: Secondary | ICD-10-CM | POA: Diagnosis not present

## 2017-02-25 DIAGNOSIS — K5900 Constipation, unspecified: Secondary | ICD-10-CM | POA: Diagnosis not present

## 2017-02-25 DIAGNOSIS — Z79899 Other long term (current) drug therapy: Secondary | ICD-10-CM | POA: Diagnosis not present

## 2017-02-25 DIAGNOSIS — R296 Repeated falls: Secondary | ICD-10-CM | POA: Diagnosis present

## 2017-02-25 DIAGNOSIS — E8770 Fluid overload, unspecified: Secondary | ICD-10-CM | POA: Diagnosis present

## 2017-02-25 DIAGNOSIS — T84030A Mechanical loosening of internal right hip prosthetic joint, initial encounter: Secondary | ICD-10-CM | POA: Diagnosis present

## 2017-02-25 DIAGNOSIS — K591 Functional diarrhea: Secondary | ICD-10-CM | POA: Diagnosis not present

## 2017-02-25 DIAGNOSIS — N3000 Acute cystitis without hematuria: Secondary | ICD-10-CM | POA: Diagnosis not present

## 2017-02-25 DIAGNOSIS — S0990XA Unspecified injury of head, initial encounter: Secondary | ICD-10-CM | POA: Diagnosis not present

## 2017-02-25 DIAGNOSIS — Z23 Encounter for immunization: Secondary | ICD-10-CM | POA: Diagnosis not present

## 2017-02-25 DIAGNOSIS — H04129 Dry eye syndrome of unspecified lacrimal gland: Secondary | ICD-10-CM | POA: Diagnosis not present

## 2017-02-25 DIAGNOSIS — M6281 Muscle weakness (generalized): Secondary | ICD-10-CM | POA: Diagnosis not present

## 2017-02-25 DIAGNOSIS — I872 Venous insufficiency (chronic) (peripheral): Secondary | ICD-10-CM | POA: Diagnosis present

## 2017-02-25 DIAGNOSIS — Z111 Encounter for screening for respiratory tuberculosis: Secondary | ICD-10-CM | POA: Diagnosis not present

## 2017-02-25 DIAGNOSIS — R609 Edema, unspecified: Secondary | ICD-10-CM | POA: Diagnosis not present

## 2017-02-25 DIAGNOSIS — E440 Moderate protein-calorie malnutrition: Secondary | ICD-10-CM | POA: Diagnosis not present

## 2017-02-25 DIAGNOSIS — Y838 Other surgical procedures as the cause of abnormal reaction of the patient, or of later complication, without mention of misadventure at the time of the procedure: Secondary | ICD-10-CM | POA: Diagnosis present

## 2017-02-25 DIAGNOSIS — Z87891 Personal history of nicotine dependence: Secondary | ICD-10-CM | POA: Diagnosis not present

## 2017-02-25 DIAGNOSIS — E871 Hypo-osmolality and hyponatremia: Secondary | ICD-10-CM | POA: Diagnosis not present

## 2017-02-25 DIAGNOSIS — Z880 Allergy status to penicillin: Secondary | ICD-10-CM | POA: Diagnosis not present

## 2017-02-25 DIAGNOSIS — R531 Weakness: Secondary | ICD-10-CM | POA: Diagnosis not present

## 2017-02-25 DIAGNOSIS — S0101XD Laceration without foreign body of scalp, subsequent encounter: Secondary | ICD-10-CM | POA: Diagnosis not present

## 2017-02-25 LAB — CBC
HCT: 35.6 % — ABNORMAL LOW (ref 39.0–52.0)
Hemoglobin: 12.5 g/dL — ABNORMAL LOW (ref 13.0–17.0)
MCH: 30.6 pg (ref 26.0–34.0)
MCHC: 35.1 g/dL (ref 30.0–36.0)
MCV: 87 fL (ref 78.0–100.0)
PLATELETS: 199 10*3/uL (ref 150–400)
RBC: 4.09 MIL/uL — ABNORMAL LOW (ref 4.22–5.81)
RDW: 13.8 % (ref 11.5–15.5)
WBC: 6.4 10*3/uL (ref 4.0–10.5)

## 2017-02-25 LAB — BASIC METABOLIC PANEL
Anion gap: 11 (ref 5–15)
BUN: 11 mg/dL (ref 6–20)
CO2: 22 mmol/L (ref 22–32)
CREATININE: 0.56 mg/dL — AB (ref 0.61–1.24)
Calcium: 8.5 mg/dL — ABNORMAL LOW (ref 8.9–10.3)
Chloride: 96 mmol/L — ABNORMAL LOW (ref 101–111)
Glucose, Bld: 99 mg/dL (ref 65–99)
Potassium: 3.8 mmol/L (ref 3.5–5.1)
SODIUM: 129 mmol/L — AB (ref 135–145)

## 2017-02-25 MED ORDER — FUROSEMIDE 20 MG PO TABS: 20.0000 mg | ORAL_TABLET | Freq: Every day | ORAL | Status: DC

## 2017-02-25 MED ORDER — SODIUM CHLORIDE 0.9 % IV SOLN
INTRAVENOUS | Status: AC
  Administered 2017-02-25: 10:00:00 via INTRAVENOUS

## 2017-02-25 NOTE — Progress Notes (Signed)
[facility] PROGRESS NOTE  David Key NOB:[id number] DOB: June 23, 1936 DOA: 02/24/2017  PCP: Wenda Low, MD  Brief History/Interval Summary: 81 year old male with past medical history of hypertension, arthritis, and status post surgery on his right hip, sustained a fall 6 days ago and has been unable to get around his house since then. Apparently, he lives by himself with his sister-in-law. X-ray of his right hip showed loosening of hardware. Orthopedics was consulted.  Consultants: Orthopedics  Procedures: None  Antibiotics: Ceftriaxone  Subjective/Interval History: Patient feels well this morning. Denies any pain. No shortness of breath.  ROS: Denies any nausea or vomiting  Objective:  Vital Signs  Vitals:   02/24/17 1500 02/24/17 1826 02/24/17 2126 02/25/17 0525  BP: (!) 154/89 (!) 163/92 (!) 155/87 134/78  Pulse: 98 (!) 102 96 (!) 101  Resp: 18  20 20   Temp: 98 F (36.7 C)  97.7 F (36.5 C) 98.1 F (36.7 C)  TempSrc: Oral  Oral Oral  SpO2: 96%  95% 94%  Weight:      Height:        Intake/Output Summary (Last 24 hours) at 02/25/17 1235 Last data filed at 02/25/17 1206  Gross per 24 hour  Intake              360 ml  Output              675 ml  Net             -315 ml   Filed Weights   02/24/17 1119  Weight: 104.3 kg (230 lb)    General appearance: alert, cooperative, appears stated age and no distress Resp: clear to auscultation bilaterally Cardio: regular rate and rhythm, S1, S2 normal, no murmur, click, rub or gallop GI: soft, non-tender; bowel sounds normal; no masses,  no organomegaly Extremities: Edema noted bilateral lower extremities. Limited range of motion right hip. Neurologic: No obvious focal neurological deficits.  Lab Results:  Data Reviewed: I have personally reviewed following labs and imaging studies  CBC:  Recent Labs Lab 02/24/17 1119 02/25/17 0517  WBC 6.2 6.4  NEUTROABS 3.9  --   HGB 12.3* 12.5*  HCT 35.2*  35.6*  MCV 87.3 87.0  PLT 184 947    Basic Metabolic Panel:  Recent Labs Lab 02/24/17 1119 02/25/17 0517  NA 131* 129*  K 4.5 3.8  CL 98* 96*  CO2 27 22  GLUCOSE 110* 99  BUN 11 11  CREATININE 0.59* 0.56*  CALCIUM 8.6* 8.5*    GFR: Estimated Creatinine Clearance: 89.1 mL/min (A) (by C-G formula based on SCr of 0.56 mg/dL (L)).  Liver Function Tests:  Recent Labs Lab 02/24/17 1119  AST 30  ALT 19  ALKPHOS 61  BILITOT 1.3*  PROT 6.7  ALBUMIN 3.3*    Cardiac Enzymes:  Recent Labs Lab 02/24/17 1119  CKTOTAL 35*     Radiology Studies: Dg Knee Complete 4 Views Right  Result Date: 02/24/2017 CLINICAL DATA:  Right knee pain since a fall 6 days ago. EXAM: RIGHT KNEE - COMPLETE 4+ VIEW COMPARISON:  08/20/2009 FINDINGS: No evidence of fracture, dislocation, or joint effusion. Tiny marginal osteophytes on the patella. Soft tissues are unremarkable. IMPRESSION: No acute abnormality.  No significant change since 2011. Electronically Signed   By: Lorriane Shire M.D.   On: 02/24/2017 11:08   Dg Hip Unilat W Or Wo Pelvis 2-3 Views Right  Result Date: 02/24/2017 CLINICAL DATA:  Bilateral hip pain, right greater than left  since a fall 6 days ago. EXAM: DG HIP (WITH OR WITHOUT PELVIS) 2-3V RIGHT COMPARISON:  Radiographs dated 08/20/2009 FINDINGS: FINDINGS There is no apparent fracture or dislocation. There is new lucency around the distal tip of the right femoral component with slight migration of the tip laterally. The left femoral component demonstrates no evidence of loosening. The acetabular components are unchanged since the prior study of 2011. No visible fractures.  Radioactive seeds in the prostate gland. IMPRESSION: 1. No fractures. 2. 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 Electronically Signed   By: Lorriane Shire M.D.   On: 02/24/2017 11:06     Medications:  Scheduled: . benazepril  10 mg Oral Daily  . enoxaparin  (LOVENOX) injection  40 mg Subcutaneous Q24H  . feeding supplement (ENSURE ENLIVE)  237 mL Oral BID BM  . [START ON 02/26/2017] furosemide  20 mg Oral Daily  . multivitamin  1 tablet Oral Daily  . polyvinyl alcohol  1 drop Both Eyes BID  . sodium chloride flush  3 mL Intravenous Q12H   Continuous: . sodium chloride 250 mL (02/24/17 1821)  . sodium chloride 75 mL/hr at 02/25/17 1004  . cefTRIAXone (ROCEPHIN)  IV 2 g (02/24/17 1827)   PQZ:RAQTMA chloride, acetaminophen **OR** acetaminophen, HYDROcodone-acetaminophen, ondansetron **OR** ondansetron (ZOFRAN) IV, sodium chloride, sodium chloride flush, zolpidem  Assessment/Plan:  Active Problems:   Urinary tract infection    Urinary tract infection with generalized weakness Continue ceftriaxone. Follow-up on urine culture reports.  Fall with concern for loosening of hardware in the right hip Patient seen by orthopedics. No plans for surgery at this time. PT and OT evaluation. Pain control.  Essential hypertension. Continue to monitor closely. Continue home medications.  Hyponatremia Could be due to volume depletion. Give him normal saline infusion. Repeat labs tomorrow. Hold Lasix for today.  DVT Prophylaxis: Lovenox    Code Status: Full code  Family Communication: Discussed with the patient  Disposition Plan: Await PT and OT evaluation. Management as outlined above.    LOS: 0 days   [facility] Pager [phone] 02/25/2017, 12:35 PM  If 7PM-7AM, please contact night-coverage at [email or web address], password [facility]

## 2017-02-25 NOTE — Progress Notes (Signed)
Nutrition Brief Note  Patient identified on the Malnutrition Screening Tool (MST) Report  Wt Readings from Last 15 Encounters:  02/24/17 230 lb (104.3 kg)  02/19/17 230 lb (104.3 kg)  12/05/13 223 lb 3.2 oz (101.2 kg)    Body mass index is 34.97 kg/m. Patient meets criteria for obese based on current BMI.   Current diet order is Heart Healthy, patient is consuming approximately 75% of meals at this time. Labs and medications reviewed.   No nutrition interventions warranted at this time. If nutrition issues arise, please consult RD.   Mariana Single RD, LDN Clinical Nutrition Pager # [phone]

## 2017-02-25 NOTE — Evaluation (Signed)
Physical Therapy Evaluation Patient Details Name: David Key MRN: [id number] DOB: [birth year] Today's Date: 02/25/2017   History of Present Illness  Pt is a 81 y.o. male, With PMHx for hypertension and arthritis s/p surgery for his right hip around 28 years ago and status post fall with scalp laceration around 6 days ago, presenting for weakness and inability to get around at home and admitted for UTI. Pt was evaluated by orthopedics and found to have loose THR, orthopedics recommends conservative management.   Clinical Impression  Pt admitted with the conditions listed above. Pt currently with functional limitations due to the deficits listed below. Pt will benefit from skilled PT to increase their independence and safety with mobility to allow discharge. Pt with severe weakness and diminished balance. Pt requires +2 max assist for bed mobility and to steady at EOB. Pt reports having volunteer emergency personnel come to his house twice a day to help transfer him in and out of his chair.     Follow Up Recommendations SNF    Equipment Recommendations  None recommended by PT    Recommendations for Other Services       Precautions / Restrictions Precautions Precautions: Fall Restrictions Weight Bearing Restrictions: Yes Other Position/Activity Restrictions: 50% PWB on R Side      Mobility  Bed Mobility Overal bed mobility: Needs Assistance Bed Mobility: Rolling;Supine to Sit Rolling: +2 for physical assistance;Max assist   Supine to sit: +2 for physical assistance;HOB elevated;Max assist     General bed mobility comments: Pt requires +2 for LE management and to lift and steady trunk  Transfers                 General transfer comment: Transfer not attempted as pt could not hold himself up at EOB   Ambulation/Gait             General Gait Details: Not attempted at this time   Stairs            Wheelchair Mobility    Modified Rankin (Stroke Patients  Only)       Balance Overall balance assessment: Needs assistance Sitting-balance support: Feet supported;Bilateral upper extremity supported Sitting balance-Leahy Scale: Zero Sitting balance - Comments: Pt requires 2 assist to steady trunk, pt continually falls backward at EOB  Postural control: Posterior lean                                   Pertinent Vitals/Pain Pain Assessment: Faces Faces Pain Scale: Hurts little more Pain Location: R thigh  Pain Descriptors / Indicators: Aching;Sore;Grimacing Pain Intervention(s): Limited activity within patient's tolerance;Repositioned;Monitored during session    Home Living Family/patient expects to be discharged to:: Private residence (with home health) Living Arrangements: Alone Available Help at Discharge: Family (Pt's sister comes once a week) Type of Home: House Home Access: Stairs to enter   [employer] of Steps: 1 Home Layout: One level Home Equipment: Hospital bed;Walker - 2 wheels;Wheelchair - Education administrator (comment) (Overhead trapeze bar on bed )      Prior Function Level of Independence: Needs assistance   Gait / Transfers Assistance Needed: Pt calls fire department to help him in and out of his chair   ADL's / Homemaking Assistance Needed: Pt's sister comes once a week to help with shopping  Comments: Pt reports being able to ambulate with RW 2 weeks ago     Hand Dominance  Extremity/Trunk Assessment   Upper Extremity Assessment Upper Extremity Assessment: Overall WFL for tasks assessed    Lower Extremity Assessment Lower Extremity Assessment: Generalized weakness;RLE deficits/detail RLE Deficits / Details: Pt has loose THR and strength 2/5 in R LE        Communication   Communication: No difficulties  Cognition Arousal/Alertness: Awake/alert Behavior During Therapy: WFL for tasks assessed/performed Overall Cognitive Status: Within Functional Limits for tasks assessed                                         General Comments      Exercises     Assessment/Plan    PT Assessment Patient needs continued PT services  PT Problem List Decreased strength;Decreased mobility;Decreased safety awareness;Decreased range of motion;Decreased activity tolerance;Decreased balance;Decreased knowledge of use of DME       PT Treatment Interventions Functional mobility training;Balance training;Patient/family education;Therapeutic exercise;Gait training;Therapeutic activities;DME instruction    PT Goals (Current goals can be found in the Care Plan section)  Acute Rehab PT Goals Patient Stated Goal: Pt wants to return home  PT Goal Formulation: With patient Time For Goal Achievement: 03/11/17 Potential to Achieve Goals: Fair    Frequency Min 3X/week   Barriers to discharge Decreased caregiver support      Co-evaluation               AM-PAC PT "6 Clicks" Daily Activity  Outcome Measure Difficulty turning over in bed (including adjusting bedclothes, sheets and blankets)?: Total Difficulty moving from lying on back to sitting on the side of the bed? : Total Difficulty sitting down on and standing up from a chair with arms (e.g., wheelchair, bedside commode, etc,.)?: Total Help needed moving to and from a bed to chair (including a wheelchair)?: Total Help needed walking in hospital room?: Total Help needed climbing 3-5 steps with a railing? : Total 6 Click Score: 6    End of Session   Activity Tolerance: Patient tolerated treatment well Patient left: in bed;with call bell/phone within reach;with bed alarm set;with nursing/sitter in room (PT left and CNA was in the room) Nurse Communication: Mobility status;Other (comment) (Pt needs trapeze bar to self assit in bed ) PT Visit Diagnosis: Muscle weakness (generalized) (M62.81);Difficulty in walking, not elsewhere classified (R26.2)    Time:  -      Charges:         PT G Codes:         David Key, SPT   David Key 02/25/2017, 1:27 PM

## 2017-02-26 DIAGNOSIS — N3000 Acute cystitis without hematuria: Secondary | ICD-10-CM

## 2017-02-26 LAB — CBC
HEMATOCRIT: 35.6 % — AB (ref 39.0–52.0)
HEMOGLOBIN: 12.5 g/dL — AB (ref 13.0–17.0)
MCH: 29.8 pg (ref 26.0–34.0)
MCHC: 35.1 g/dL (ref 30.0–36.0)
MCV: 85 fL (ref 78.0–100.0)
Platelets: 177 10*3/uL (ref 150–400)
RBC: 4.19 MIL/uL — AB (ref 4.22–5.81)
RDW: 13.6 % (ref 11.5–15.5)
WBC: 5.8 10*3/uL (ref 4.0–10.5)

## 2017-02-26 LAB — BASIC METABOLIC PANEL
ANION GAP: 8 (ref 5–15)
Anion gap: 9 (ref 5–15)
BUN: 9 mg/dL (ref 6–20)
BUN: 9 mg/dL (ref 6–20)
CHLORIDE: 99 mmol/L — AB (ref 101–111)
CHLORIDE: 97 mmol/L — AB (ref 101–111)
CO2: 21 mmol/L — AB (ref 22–32)
CO2: 24 mmol/L (ref 22–32)
Calcium: 8.3 mg/dL — ABNORMAL LOW (ref 8.9–10.3)
Calcium: 8.4 mg/dL — ABNORMAL LOW (ref 8.9–10.3)
Creatinine, Ser: 0.49 mg/dL — ABNORMAL LOW (ref 0.61–1.24)
Creatinine, Ser: 0.56 mg/dL — ABNORMAL LOW (ref 0.61–1.24)
GFR calc Af Amer: >60 mL/min (ref >60)
GFR calc Af Amer: >60 mL/min (ref >60)
GFR calc non Af Amer: >60 mL/min (ref >60)
GFR calc non Af Amer: >60 mL/min (ref >60)
GLUCOSE: 102 mg/dL — AB (ref 65–99)
GLUCOSE: 103 mg/dL — AB (ref 65–99)
POTASSIUM: 4.2 mmol/L (ref 3.5–5.1)
Potassium: 3.9 mmol/L (ref 3.5–5.1)
Sodium: 129 mmol/L — ABNORMAL LOW (ref 135–145)
Sodium: 129 mmol/L — ABNORMAL LOW (ref 135–145)

## 2017-02-26 LAB — URINE CULTURE

## 2017-02-26 MED ORDER — METOPROLOL TARTRATE 25 MG PO TABS
25.0000 mg | ORAL_TABLET | Freq: Two times a day (BID) | ORAL | Status: DC
  Administered 2017-02-26 – 2017-03-01 (×7): 25 mg via ORAL
  Filled 2017-02-26 (×7): qty 1

## 2017-02-26 MED ORDER — HYDRALAZINE HCL 20 MG/ML IJ SOLN: 10.0000 mg | INTRAMUSCULAR | Status: DC | PRN

## 2017-02-26 NOTE — NC FL2 (Signed)
[place] LEVEL OF CARE SCREENING TOOL     IDENTIFICATION  Patient Name: David Key Birthdate: [birth year] Sex: male Admission Date (Current Location): 02/24/2017  [facility] and [place] Number:  Herbalist and Address:  [facility],  [place], [place]      Provider Number: [id number]  Attending Physician Name and Address:  Lavina Hamman, MD  Relative Name and Phone Number:       Current Level of Care: Hospital Recommended Level of Care: [place] Prior Approval Number:    Date Approved/Denied:   PASRR Number: [id number]  Discharge Plan: SNF    Current Diagnoses: Patient Active Problem List   Diagnosis Date Noted  . Urinary tract infection 02/24/2017  . Closed fracture of left humerus 12/05/2013  . Pre-syncope 12/05/2013  . Hyponatremia 12/05/2013  . Alcohol abuse 12/05/2013  . Bilateral leg edema 12/05/2013  . Chronic venous stasis dermatitis 12/05/2013  . HTN (hypertension) 12/05/2013  . UTI (urinary tract infection) 12/05/2013    Orientation RESPIRATION BLADDER Height & Weight     Self, Time, Situation, Place  Normal Continent Weight: 230 lb (104.3 kg) Height:  5\' 8"  (172.7 cm)  BEHAVIORAL SYMPTOMS/MOOD NEUROLOGICAL BOWEL NUTRITION STATUS      Continent Diet (Heart Healthy )  AMBULATORY STATUS COMMUNICATION OF NEEDS Skin   Extensive Assist Verbally Other (Comment) (Wound Incision back posterior moisture associated breakdown )                       Personal Care Assistance Level of Assistance  Bathing, Feeding, Dressing Bathing Assistance: Limited assistance Feeding assistance: Independent Dressing Assistance: Limited assistance     Functional Limitations Info  Sight, Hearing, Speech Sight Info: Adequate Hearing Info: Adequate Speech Info: Adequate    SPECIAL CARE FACTORS FREQUENCY  PT (By licensed PT)     PT Frequency: Min 3X/week              Contractures  Contractures Info: Not present    Additional Factors Info  Code Status, Allergies Code Status Info: Fullcode Allergies Info: Penicillins           Current Medications (02/26/2017):  This is the current hospital active medication list Current Facility-Administered Medications  Medication Dose Route Frequency Provider Last Rate Last Dose  . 0.9 %  sodium chloride infusion  250 mL Intravenous PRN Merton Border, MD 10 mL/hr at 02/24/17 1821 250 mL at 02/24/17 1821  . acetaminophen (TYLENOL) tablet 650 mg  650 mg Oral Q6H PRN Merton Border, MD       Or  . acetaminophen (TYLENOL) suppository 650 mg  650 mg Rectal Q6H PRN Merton Border, MD      . benazepril (LOTENSIN) tablet 10 mg  10 mg Oral Daily Merton Border, MD   10 mg at 02/26/17 1028  . cefTRIAXone (ROCEPHIN) 2 g in dextrose 5 % 50 mL IVPB  2 g Intravenous Q24H Angela Adam, [facility]   Stopped at 02/25/17 1625  . enoxaparin (LOVENOX) injection 40 mg  40 mg Subcutaneous Q24H Merton Border, MD   40 mg at 02/25/17 2121  . feeding supplement (ENSURE ENLIVE) (ENSURE ENLIVE) liquid 237 mL  237 mL Oral BID BM Merton Border, MD   237 mL at 02/26/17 1000  . hydrALAZINE (APRESOLINE) injection 10 mg  10 mg Intravenous Q4H PRN Lavina Hamman, MD      . HYDROcodone-acetaminophen (NORCO/VICODIN) 5-325 MG per tablet 1-2 tablet  1-2 tablet Oral Q4H PRN Merton Border, MD      . metoprolol tartrate (LOPRESSOR) tablet 25 mg  25 mg Oral BID Lavina Hamman, MD   25 mg at 02/26/17 1028  . multivitamin (PROSIGHT) tablet 1 tablet  1 tablet Oral Daily Merton Border, MD   1 tablet at 02/26/17 1028  . ondansetron (ZOFRAN) tablet 4 mg  4 mg Oral Q6H PRN Merton Border, MD       Or  . ondansetron (ZOFRAN) injection 4 mg  4 mg Intravenous Q6H PRN Merton Border, MD      . polyvinyl alcohol (LIQUIFILM TEARS) 1.4 % ophthalmic solution 1 drop  1 drop Both Eyes BID Merton Border, MD   1 drop at 02/26/17 1028  . sodium chloride (OCEAN) 0.65 % nasal spray 1 spray  1 spray Each Nare PRN Merton Border, MD      . sodium chloride flush (NS) 0.9 % injection 3 mL  3 mL Intravenous Q12H Merton Border, MD   3 mL at 02/24/17 2138  . sodium chloride flush (NS) 0.9 % injection 3 mL  3 mL Intravenous PRN Merton Border, MD      . zolpidem (AMBIEN) tablet 5 mg  5 mg Oral QHS PRN Merton Border, MD         Discharge Medications: Please see discharge summary for a list of discharge medications.  Relevant Imaging Results:  Relevant Lab Results:   Additional Information ssn:[id number]  Lia Hopping, LCSW

## 2017-02-26 NOTE — Progress Notes (Signed)
[facility] PROGRESS NOTE  Ballard Budney ESP:[id number] DOB: [birth year] DOA: 02/24/2017  PCP: Wenda Low, MD  Brief History/Interval Summary: 81 year old male with past medical history of hypertension, arthritis, and status post surgery on his right hip, sustained a fall 6 days ago and has been unable to get around his house since then. Apparently, he lives by himself with his sister-in-law. X-ray of his right hip showed loosening of hardware. Orthopedics was consulted.  Consultants: Orthopedics  Procedures: None  Antibiotics: Ceftriaxone  Subjective/Interval History: Continues to feel fatigue and tired. No nausea no vomiting no. Oral intake is appropriate. No headache or dizziness.  ROS: Denies any nausea or vomiting  Objective:  Vital Signs  Vitals:   02/25/17 1303 02/25/17 2022 02/26/17 0433 02/26/17 1427  BP: (!) 142/89 (!) 158/91 (!) 159/96 127/79  Pulse: 97 100 (!) 102 89  Resp: 18 20 18 20   Temp: 97.7 F (36.5 C) 98.2 F (36.8 C) 98.2 F (36.8 C) 98 F (36.7 C)  TempSrc: Oral Oral Oral Oral  SpO2: 94% 95% 94% 95%  Weight:      Height:        Intake/Output Summary (Last 24 hours) at 02/26/17 1744 Last data filed at 02/26/17 1345  Gross per 24 hour  Intake              960 ml  Output              475 ml  Net              485 ml   Filed Weights   02/24/17 1119  Weight: 104.3 kg (230 lb)    General appearance: alert, cooperative, appears stated age and no distress Resp: clear to auscultation bilaterally Cardio: regular rate and rhythm, S1, S2 normal, no murmur, click, rub or gallop GI: soft, non-tender; bowel sounds normal; no masses,  no organomegaly Extremities: Edema noted bilateral lower extremities. Limited range of motion right hip. Neurologic: No obvious focal neurological deficits.  Lab Results:  Data Reviewed: I have personally reviewed following labs and imaging studies  CBC:  Recent Labs Lab 02/24/17 1119 02/25/17 0517  02/26/17 0705  WBC 6.2 6.4 5.8  NEUTROABS 3.9  --   --   HGB 12.3* 12.5* 12.5*  HCT 35.2* 35.6* 35.6*  MCV 87.3 87.0 85.0  PLT 184 199 633    Basic Metabolic Panel:  Recent Labs Lab 02/24/17 1119 02/25/17 0517 02/26/17 0705  NA 131* 129* 129*  K 4.5 3.8 3.9  CL 98* 96* 99*  CO2 27 22 21*  GLUCOSE 110* 99 102*  BUN 11 11 9   CREATININE 0.59* 0.56* 0.49*  CALCIUM 8.6* 8.5* 8.3*    GFR: Estimated Creatinine Clearance: 89.1 mL/min (A) (by C-G formula based on SCr of 0.49 mg/dL (L)).  Liver Function Tests:  Recent Labs Lab 02/24/17 1119  AST 30  ALT 19  ALKPHOS 61  BILITOT 1.3*  PROT 6.7  ALBUMIN 3.3*    Cardiac Enzymes:  Recent Labs Lab 02/24/17 1119  CKTOTAL 35*     Radiology Studies: No results found.   Medications:  Scheduled: . benazepril  10 mg Oral Daily  . enoxaparin (LOVENOX) injection  40 mg Subcutaneous Q24H  . feeding supplement (ENSURE ENLIVE)  237 mL Oral BID BM  . metoprolol tartrate  25 mg Oral BID  . multivitamin  1 tablet Oral Daily  . polyvinyl alcohol  1 drop Both Eyes BID  . sodium chloride flush  3  mL Intravenous Q12H   Continuous: . sodium chloride 250 mL (02/24/17 1821)  . cefTRIAXone (ROCEPHIN)  IV Stopped (02/25/17 1625)   PNT:IRWERX chloride, acetaminophen **OR** acetaminophen, hydrALAZINE, HYDROcodone-acetaminophen, ondansetron **OR** ondansetron (ZOFRAN) IV, sodium chloride, sodium chloride flush, zolpidem  Assessment/Plan:  Active Problems:   UTI (urinary tract infection)   Urinary tract infection    Urinary tract infection with generalized weakness Continue ceftriaxone. We will repeat urinary culture to guide treatment therapy.  Fall with concern for loosening of hardware in the right hip Patient seen by orthopedics. No plans for surgery at this time. PT and OT evaluation. Pain control.  Essential hypertension. Continue to monitor closely. Continue home medications.  Hyponatremia Could be due to volume  depletion. Patient was given normal saline infusion. But Lasix was also continued. At present I would discontinue Lasix and monitor. Continue gentle IV hydration. Recheck sodium level.  Pedal edema. Rule out DVT with Doppler.  DVT Prophylaxis: Lovenox    Code Status: Full code  Family Communication: Discussed with the patient  Disposition Plan: SNF   LOS: 1 day   Author:  Berle Mull, MD [facility] Pager: [phone] 02/26/2017 5:45 PM     If 7PM-7AM, please contact night-coverage at [email or web address], password [facility]

## 2017-02-26 NOTE — Evaluation (Signed)
Occupational Therapy Evaluation Patient Details Name: David Key MRN: [id number] DOB: [birth year] Today's Date: 02/26/2017    History of Present Illness Pt is a 81 y.o. male, With PMHx for hypertension and arthritis s/p surgery for his right hip around 28 years ago and status post fall with scalp laceration around 6 days ago, presenting for weakness and inability to get around at home and admitted for UTI. Pt was evaluated by orthopedics and found to have loose THR, orthopedics recommends conservative management.    Clinical Impression   Pt was admitted for the above. He has had a recent significant decline in adls from mod I to having 3 EMS people assist him to w/c.  He will benefit from continued OT in acute setting as well as follow up therapy at SNF.  Goals in acute are for trunk balance, strengthening, and bed mobility as precursors for advancing independence with adls    Follow Up Recommendations  SNF    Equipment Recommendations  None recommended by OT    Recommendations for Other Services       Precautions / Restrictions Precautions Precautions: Fall Restrictions Weight Bearing Restrictions: Yes RLE Weight Bearing: Partial weight bearing RLE Partial Weight Bearing Percentage or Pounds: 50%      Mobility Bed Mobility Overal bed mobility: Needs Assistance       Supine to sit: Max assist;+2 for physical assistance Sit to supine: Total assist;+2 for physical assistance   General bed mobility comments: Pt requires +2 for trunk and RLE to sit up. More assistance for back to bed due to fatique  Transfers                 General transfer comment: unable    Balance     Sitting balance-Leahy Scale: Fair Sitting balance - Comments: can briefly maintain balance with bil UE support and feet supported but needs 2 people on stand by as pt tends to slide forward.  Mostly min to mod A to support self                                   ADL either  performed or assessed with clinical judgement   ADL Overall ADL's : Needs assistance/impaired     Grooming: Set up;Bed level   Upper Body Bathing: Set up;Bed level   Lower Body Bathing: Total assistance;Bed level;+2 for physical assistance   Upper Body Dressing : Minimal assistance;Bed level   Lower Body Dressing: Total assistance;+2 for physical assistance;Bed level                 General ADL Comments: pt cannot maintain balance sitting eob for adls; leans posteriorly     Vision         Perception     Praxis      Pertinent Vitals/Pain Pain Assessment: Faces Faces Pain Scale: Hurts little more Pain Location: R thigh  Pain Descriptors / Indicators: Aching Pain Intervention(s): Limited activity within patient's tolerance;Monitored during session;Premedicated before session;Repositioned     Hand Dominance     Extremity/Trunk Assessment Upper Extremity Assessment Upper Extremity Assessment:  (LUE 4/5; RUE 4-/5)           Communication Communication Communication: No difficulties   Cognition Arousal/Alertness: Awake/alert Behavior During Therapy: WFL for tasks assessed/performed Overall Cognitive Status: Within Functional Limits for tasks assessed  General Comments: history a little vague at times   General Comments       Exercises     Shoulder Instructions      Home Living Family/patient expects to be discharged to:: Skilled nursing facility Living Arrangements: Alone                                      Prior Functioning/Environment Level of Independence: Needs assistance        Comments: pt states that prior to a week ago, he was mod I with adls. A/C went out and he was having difficulty moving. called ems to get him to w/c and back to bed.  Pt has reacher for pants. He was using urinal and was constipated so did not use toilet        OT Problem List: Decreased  strength;Decreased activity tolerance;Impaired balance (sitting and/or standing);Decreased knowledge of use of DME or AE;Pain      OT Treatment/Interventions: Self-care/ADL training;DME and/or AE instruction;Patient/family education;Balance training;Therapeutic activities    OT Goals(Current goals can be found in the care plan section) Acute Rehab OT Goals Patient Stated Goal: get strength back; hopes for rehab prior to home OT Goal Formulation: With patient Time For Goal Achievement: 03/05/17 Potential to Achieve Goals: Good ADL Goals Additional ADL Goal #2: pt will sit eob x 2 minutes with min guard assist in preparation for seated adls Additional ADL Goal #3: pt will perform bed mobility with mod +2 assist in preparation for adls Additional ADL Goal #4: pt will perform level 2 theraband exercises with written HEPand cues to increase strength for bil UEs for mobility related to adls  OT Frequency: Min 2X/week   Barriers to D/C:            Co-evaluation              AM-PAC PT "6 Clicks" Daily Activity     Outcome Measure Help from another person eating meals?: None Help from another person taking care of personal grooming?: A Little Help from another person toileting, which includes using toliet, bedpan, or urinal?: A Lot Help from another person bathing (including washing, rinsing, drying)?: A Lot Help from another person to put on and taking off regular upper body clothing?: A Little Help from another person to put on and taking off regular lower body clothing?: A Lot 6 Click Score: 16   End of Session    Activity Tolerance: Patient tolerated treatment well Patient left: in bed;with call bell/phone within reach;with bed alarm set  OT Visit Diagnosis: Muscle weakness (generalized) (M62.81);Pain Pain - Right/Left: Right Pain - part of body: Leg                Time:  -    Charges:  OT General Charges $OT Visit: 1 Procedure OT Evaluation $OT Eval Moderate  Complexity: 1 Procedure G-Codes:     Lesle Chris, OTR/L [phone] 02/26/2017  Psalm Arman 02/26/2017, 12:51 PM

## 2017-02-26 NOTE — Progress Notes (Signed)
Physical Therapy Treatment Patient Details Name: David Key MRN: [id number] DOB: 26-Feb-1936 Today's Date: 02/26/2017    History of Present Illness Pt is a 81 y.o. male, With PMHx for hypertension and arthritis s/p surgery for his right hip around 28 years ago and status post fall with scalp laceration around 6 days ago, presenting for weakness and inability to get around at home and admitted for UTI. Pt was evaluated by orthopedics and found to have loose THR, orthopedics recommends conservative management.     PT Comments    The patient does demonstrate very small improvement in sitting at the bed edge, with improved forward flexion of the trunk. Continues  To require extensive assistance of 2  for all aspects of bed  Mobility and for sitting. Continues to lean posteriorly significantly. Patient  States that he will go to  A rehab facility.   Follow Up Recommendations  SNF     Equipment Recommendations  None recommended by PT    Recommendations for Other Services       Precautions / Restrictions Precautions Precautions: Fall Restrictions Weight Bearing Restrictions: Yes RLE Weight Bearing: Partial weight bearing RLE Partial Weight Bearing Percentage or Pounds: 50%    Mobility  Bed Mobility Overal bed mobility: Needs Assistance       Supine to sit: Max assist;+2 for physical assistance Sit to supine: Total assist;+2 for physical assistance   General bed mobility comments: Pt requires +2 for trunk and RLE to sit up.  continues to have significant posterior leaning  Transfers                 General transfer comment: unable  Ambulation/Gait                 Stairs            Wheelchair Mobility    Modified Rankin (Stroke Patients Only)       Balance Overall balance assessment: Needs assistance Sitting-balance support: Feet supported;Bilateral upper extremity supported Sitting balance-Leahy Scale: Poor Sitting balance - Comments: can  briefly maintain balance with bil UE support and feet supported but needs 2 people on stand by as pt tends to slide forward.  Mostly mod -max A to support self. Pulls on therapist to keep trunk forward. Postural control: Posterior lean                                  Cognition Arousal/Alertness: Awake/alert Behavior During Therapy: WFL for tasks assessed/performed Overall Cognitive Status: Within Functional Limits for tasks assessed                                 General Comments: history a little vague at times      Exercises      General Comments        Pertinent Vitals/Pain Pain Assessment: Faces Faces Pain Scale: Hurts little more Pain Location: R thigh  Pain Descriptors / Indicators: Aching Pain Intervention(s): Limited activity within patient's tolerance;Monitored during session;Premedicated before session;Repositioned    Home Living Family/patient expects to be discharged to:: Skilled nursing facility Living Arrangements: Alone                  Prior Function Level of Independence: Needs assistance      Comments: pt states that prior to a week ago, he was mod I with adls.  A/C went out and he was having difficulty moving. called ems to get him to w/c and back to bed.  Pt has reacher for pants. He was using urinal and was constipated so did not use toilet   PT Goals (current goals can now be found in the care plan section) Acute Rehab PT Goals Patient Stated Goal: get strength back; hopes for rehab prior to home Progress towards PT goals: Progressing toward goals    Frequency    Min 3X/week      PT Plan Current plan remains appropriate    Co-evaluation PT/OT/SLP Co-Evaluation/Treatment: Yes Reason for Co-Treatment: Complexity of the patient's impairments (multi-system involvement);For patient/therapist safety PT goals addressed during session: Mobility/safety with mobility OT goals addressed during session: ADL's and  self-care      AM-PAC PT "6 Clicks" Daily Activity  Outcome Measure  Difficulty turning over in bed (including adjusting bedclothes, sheets and blankets)?: Total Difficulty moving from lying on back to sitting on the side of the bed? : Total Difficulty sitting down on and standing up from a chair with arms (e.g., wheelchair, bedside commode, etc,.)?: Total Help needed moving to and from a bed to chair (including a wheelchair)?: Total Help needed walking in hospital room?: Total Help needed climbing 3-5 steps with a railing? : Total 6 Click Score: 6    End of Session   Activity Tolerance: Patient tolerated treatment well Patient left: in bed;with call bell/phone within reach;with bed alarm set;with nursing/sitter in room Nurse Communication: Mobility status;Other (comment)       Time: 1040-1109 PT Time Calculation (min) (ACUTE ONLY): 29 min  Charges:  $Therapeutic Activity: 8-22 mins                    G CodesTresa Endo PT [phone]    Claretha Cooper 02/26/2017, 1:28 PM

## 2017-02-27 ENCOUNTER — Inpatient Hospital Stay (HOSPITAL_COMMUNITY): Payer: Medicare Other

## 2017-02-27 DIAGNOSIS — R609 Edema, unspecified: Secondary | ICD-10-CM

## 2017-02-27 LAB — COMPREHENSIVE METABOLIC PANEL
ALK PHOS: 66 U/L (ref 38–126)
ALT: 15 U/L — AB (ref 17–63)
AST: 28 U/L (ref 15–41)
Albumin: 3.1 g/dL — ABNORMAL LOW (ref 3.5–5.0)
Anion gap: 7 (ref 5–15)
BILIRUBIN TOTAL: 0.9 mg/dL (ref 0.3–1.2)
BUN: 9 mg/dL (ref 6–20)
CALCIUM: 8.5 mg/dL — AB (ref 8.9–10.3)
CHLORIDE: 96 mmol/L — AB (ref 101–111)
CO2: 24 mmol/L (ref 22–32)
CREATININE: 0.57 mg/dL — AB (ref 0.61–1.24)
Glucose, Bld: 101 mg/dL — ABNORMAL HIGH (ref 65–99)
Potassium: 4.4 mmol/L (ref 3.5–5.1)
Sodium: 127 mmol/L — ABNORMAL LOW (ref 135–145)
Total Protein: 6.8 g/dL (ref 6.5–8.1)

## 2017-02-27 LAB — OSMOLALITY: OSMOLALITY: 269 mosm/kg — AB (ref 275–295)

## 2017-02-27 LAB — CBC WITH DIFFERENTIAL/PLATELET
BASOS ABS: 0 10*3/uL (ref 0.0–0.1)
Basophils Relative: 1 %
Eosinophils Absolute: 0.1 10*3/uL (ref 0.0–0.7)
Eosinophils Relative: 1 %
HEMATOCRIT: 36.3 % — AB (ref 39.0–52.0)
HEMOGLOBIN: 12.8 g/dL — AB (ref 13.0–17.0)
LYMPHS ABS: 1.1 10*3/uL (ref 0.7–4.0)
LYMPHS PCT: 17 %
MCH: 29.8 pg (ref 26.0–34.0)
MCHC: 35.3 g/dL (ref 30.0–36.0)
MCV: 84.6 fL (ref 78.0–100.0)
Monocytes Absolute: 1.1 10*3/uL — ABNORMAL HIGH (ref 0.1–1.0)
Monocytes Relative: 18 %
NEUTROS ABS: 4.2 10*3/uL (ref 1.7–7.7)
NEUTROS PCT: 63 %
PLATELETS: 186 10*3/uL (ref 150–400)
RBC: 4.29 MIL/uL (ref 4.22–5.81)
RDW: 13.6 % (ref 11.5–15.5)
WBC: 6.5 10*3/uL (ref 4.0–10.5)

## 2017-02-27 LAB — CORTISOL: Cortisol, Plasma: 13.1 ug/dL

## 2017-02-27 LAB — BASIC METABOLIC PANEL
ANION GAP: 7 (ref 5–15)
BUN: 11 mg/dL (ref 6–20)
CHLORIDE: 94 mmol/L — AB (ref 101–111)
CO2: 26 mmol/L (ref 22–32)
CREATININE: 0.59 mg/dL — AB (ref 0.61–1.24)
Calcium: 8.6 mg/dL — ABNORMAL LOW (ref 8.9–10.3)
GFR calc Af Amer: >60 mL/min (ref >60)
GFR calc non Af Amer: >60 mL/min (ref >60)
Glucose, Bld: 130 mg/dL — ABNORMAL HIGH (ref 65–99)
POTASSIUM: 4 mmol/L (ref 3.5–5.1)
Sodium: 127 mmol/L — ABNORMAL LOW (ref 135–145)

## 2017-02-27 LAB — TSH: TSH: 2.393 u[IU]/mL (ref 0.350–4.500)

## 2017-02-27 LAB — MAGNESIUM: MAGNESIUM: 1.8 mg/dL (ref 1.7–2.4)

## 2017-02-27 LAB — NA AND K (SODIUM & POTASSIUM), RAND UR
Potassium Urine: 17 mmol/L
Sodium, Ur: 130 mmol/L

## 2017-02-27 LAB — OSMOLALITY, URINE: Osmolality, Ur: 371 mOsm/kg (ref 300–900)

## 2017-02-27 MED ORDER — FUROSEMIDE 10 MG/ML IJ SOLN
20.0000 mg | Freq: Once | INTRAMUSCULAR | Status: AC
  Administered 2017-02-27: 20 mg via INTRAVENOUS
  Filled 2017-02-27: qty 2

## 2017-02-27 NOTE — Progress Notes (Signed)
[facility] PROGRESS NOTE  David Key [id number] DOB: October 29, 1935 DOA: 02/24/2017  PCP: Wenda Low, MD  Brief History/Interval Summary: 81 year old male with past medical history of hypertension, arthritis, and status post surgery on his right hip, sustained a fall 6 days ago and has been unable to get around his house since then. Apparently, he lives by himself with his sister-in-law. X-ray of his right hip showed loosening of hardware. Orthopedics was consulted.  Consultants: Orthopedics  Procedures: None  Antibiotics: Ceftriaxone  Subjective/Interval History: Feeling better. No dizziness or lightheadedness. Complains about left shoulder pain. No diarrhea no constipation.  ROS: Denies any nausea or vomiting  Objective:  Vital Signs  Vitals:   02/26/17 1427 02/26/17 2023 02/27/17 0556 02/27/17 1000  BP: 127/79 (!) 150/91 (!) 149/92   Pulse: 89 88 90   Resp: 20 18 20    Temp: 98 F (36.7 C) (!) 97.5 F (36.4 C) 98.5 F (36.9 C)   TempSrc: Oral Oral Oral   SpO2: 95% 94% 94%   Weight:    103.2 kg (227 lb 8 oz)  Height:        Intake/Output Summary (Last 24 hours) at 02/27/17 1444 Last data filed at 02/27/17 1114  Gross per 24 hour  Intake              240 ml  Output              700 ml  Net             -460 ml   Filed Weights   02/24/17 1119 02/27/17 1000  Weight: 104.3 kg (230 lb) 103.2 kg (227 lb 8 oz)    General appearance: alert, cooperative, appears stated age and no distress Resp: clear to auscultation bilaterally Cardio: regular rate and rhythm, S1, S2 normal, no murmur, click, rub or gallop GI: soft, non-tender; bowel sounds normal; no masses,  no organomegaly Extremities: Edema noted bilateral lower extremities. Limited range of motion right hip. Neurologic: No obvious focal neurological deficits.  Lab Results:  Data Reviewed: I have personally reviewed following labs and imaging studies  CBC:  Recent Labs Lab 02/24/17 1119  02/25/17 0517 02/26/17 0705 02/27/17 0557  WBC 6.2 6.4 5.8 6.5  NEUTROABS 3.9  --   --  4.2  HGB 12.3* 12.5* 12.5* 12.8*  HCT 35.2* 35.6* 35.6* 36.3*  MCV 87.3 87.0 85.0 84.6  PLT 184 199 177 474    Basic Metabolic Panel:  Recent Labs Lab 02/24/17 1119 02/25/17 0517 02/26/17 0705 02/26/17 1933 02/27/17 0557  NA 131* 129* 129* 129* 127*  K 4.5 3.8 3.9 4.2 4.4  CL 98* 96* 99* 97* 96*  CO2 27 22 21* 24 24  GLUCOSE 110* 99 102* 103* 101*  BUN 11 11 9 9 9   CREATININE 0.59* 0.56* 0.49* 0.56* 0.57*  CALCIUM 8.6* 8.5* 8.3* 8.4* 8.5*  MG  --   --   --   --  1.8    GFR: Estimated Creatinine Clearance: 88.6 mL/min (A) (by C-G formula based on SCr of 0.57 mg/dL (L)).  Liver Function Tests:  Recent Labs Lab 02/24/17 1119 02/27/17 0557  AST 30 28  ALT 19 15*  ALKPHOS 61 66  BILITOT 1.3* 0.9  PROT 6.7 6.8  ALBUMIN 3.3* 3.1*    Cardiac Enzymes:  Recent Labs Lab 02/24/17 1119  CKTOTAL 35*     Radiology Studies: No results found.   Medications:  Scheduled: . benazepril  10 mg Oral Daily  .  enoxaparin (LOVENOX) injection  40 mg Subcutaneous Q24H  . feeding supplement (ENSURE ENLIVE)  237 mL Oral BID BM  . metoprolol tartrate  25 mg Oral BID  . multivitamin  1 tablet Oral Daily  . polyvinyl alcohol  1 drop Both Eyes BID  . sodium chloride flush  3 mL Intravenous Q12H   Continuous: . sodium chloride 250 mL (02/24/17 1821)  . cefTRIAXone (ROCEPHIN)  IV 2 g (02/26/17 1759)   JWJ:XBJYNW chloride, acetaminophen **OR** acetaminophen, hydrALAZINE, HYDROcodone-acetaminophen, ondansetron **OR** ondansetron (ZOFRAN) IV, sodium chloride, sodium chloride flush, zolpidem  Assessment/Plan:  Active Problems:   UTI (urinary tract infection)   Urinary tract infection    Urinary tract infection with generalized weakness Continue ceftriaxone. We will repeat urinary culture to guide treatment therapy.  Fall with concern for loosening of hardware in the right  hip Patient seen by orthopedics. No plans for surgery at this time. PT and OT evaluation. Pain control.  Essential hypertension. Continue to monitor closely. Continue home medications.  Hyponatremia , Worsening today Serum osmolarity low, urine sodium elevated, urine osmolality normal. Possibly actually hypoosmolar hyponatremia. We will give IV Lasix and monitor sodium level. Also add fluid restriction. Recheck at 4 PM.  Pedal edema. Rule out DVT with Doppler.  DVT Prophylaxis: Lovenox    Code Status: Full code  Family Communication: Discussed with the patient  Disposition Plan: SNF   LOS: 2 days   Author:  Berle Mull, MD [facility] Pager: [phone] 02/27/2017 2:44 PM     If 7PM-7AM, please contact night-coverage at [email or web address], password [facility]

## 2017-02-27 NOTE — Progress Notes (Signed)
VASCULAR LAB PRELIMINARY  PRELIMINARY  PRELIMINARY  PRELIMINARY  Bilateral lower extremity venous duplex completed.    Preliminary report:  Bilateral:  No evidence of DVT, superficial thrombosis, or Baker's Cyst.   Jazen Spraggins, RVS 02/27/2017, 10:17 AM

## 2017-02-28 LAB — BASIC METABOLIC PANEL
ANION GAP: 10 (ref 5–15)
ANION GAP: 7 (ref 5–15)
Anion gap: 8 (ref 5–15)
BUN: 11 mg/dL (ref 6–20)
BUN: 13 mg/dL (ref 6–20)
BUN: 12 mg/dL (ref 6–20)
CALCIUM: 8.6 mg/dL — AB (ref 8.9–10.3)
CHLORIDE: 95 mmol/L — AB (ref 101–111)
CO2: 21 mmol/L — ABNORMAL LOW (ref 22–32)
CO2: 25 mmol/L (ref 22–32)
CO2: 23 mmol/L (ref 22–32)
CREATININE: 0.53 mg/dL — AB (ref 0.61–1.24)
Calcium: 8.5 mg/dL — ABNORMAL LOW (ref 8.9–10.3)
Calcium: 8.4 mg/dL — ABNORMAL LOW (ref 8.9–10.3)
Chloride: 96 mmol/L — ABNORMAL LOW (ref 101–111)
Chloride: 99 mmol/L — ABNORMAL LOW (ref 101–111)
Creatinine, Ser: 0.5 mg/dL — ABNORMAL LOW (ref 0.61–1.24)
Creatinine, Ser: 0.53 mg/dL — ABNORMAL LOW (ref 0.61–1.24)
GFR calc Af Amer: >60 mL/min (ref >60)
GFR calc Af Amer: >60 mL/min (ref >60)
GFR calc non Af Amer: >60 mL/min (ref >60)
GLUCOSE: 121 mg/dL — AB (ref 65–99)
Glucose, Bld: 99 mg/dL (ref 65–99)
Glucose, Bld: 121 mg/dL — ABNORMAL HIGH (ref 65–99)
POTASSIUM: 4 mmol/L (ref 3.5–5.1)
POTASSIUM: 4.1 mmol/L (ref 3.5–5.1)
Potassium: 4.1 mmol/L (ref 3.5–5.1)
SODIUM: 126 mmol/L — AB (ref 135–145)
SODIUM: 128 mmol/L — AB (ref 135–145)
SODIUM: 130 mmol/L — AB (ref 135–145)

## 2017-02-28 LAB — URINE CULTURE: Culture: NO GROWTH

## 2017-02-28 LAB — MAGNESIUM: MAGNESIUM: 1.8 mg/dL (ref 1.7–2.4)

## 2017-02-28 MED ORDER — SODIUM CHLORIDE 1 G PO TABS
2.0000 g | ORAL_TABLET | Freq: Three times a day (TID) | ORAL | Status: DC
  Administered 2017-02-28 – 2017-03-01 (×5): 2 g via ORAL
  Filled 2017-02-28 (×5): qty 2

## 2017-02-28 NOTE — Progress Notes (Signed)
Patient agreeable to complete Rehab at [facility]. Patient requested CSW call sister in law to inform her with update. CSW called 2x's unable to leave voicemail. CSW will continue to assist with discharge once medically stable.   Kathrin Greathouse, Latanya Presser, MSW Clinical Social Worker 5E and Psychiatric Service Line [phone] 02/28/2017  1:08 PM

## 2017-02-28 NOTE — Progress Notes (Signed)
[facility] PROGRESS NOTE  David Key GUY:[id number] DOB: 1936/03/29 DOA: 02/24/2017  PCP: Wenda Low, MD  Brief History/Interval Summary: 81 year old male with past medical history of hypertension, arthritis, and status post surgery on his right hip, sustained a fall 6 days ago and has been unable to get around his house since then. Apparently, he lives by himself with his sister-in-law. X-ray of his right hip showed loosening of hardware. Orthopedics was consulted.  Consultants: Orthopedics  Procedures: None  Antibiotics: Ceftriaxone  Subjective/Interval History: Again complain about fatigue. No dizziness no lightheadedness. No chest pain and abdominal pain.  ROS: Denies any nausea or vomiting  Objective:  Vital Signs  Vitals:   02/27/17 1511 02/27/17 2104 02/28/17 0646 02/28/17 1448  BP: (!) 146/88 136/78 (!) 150/86 137/82  Pulse: 87 95 100 89  Resp: 18 18 18 20   Temp: 97.8 F (36.6 C) 98.7 F (37.1 C) 97.7 F (36.5 C) 98.2 F (36.8 C)  TempSrc: Oral Oral Oral Oral  SpO2: 95% 95% 95% 94%  Weight:    102.5 kg (226 lb)  Height:        Intake/Output Summary (Last 24 hours) at 02/28/17 1542 Last data filed at 02/28/17 5638  Gross per 24 hour  Intake              360 ml  Output              900 ml  Net             -540 ml   Filed Weights   02/24/17 1119 02/27/17 1000 02/28/17 1448  Weight: 104.3 kg (230 lb) 103.2 kg (227 lb 8 oz) 102.5 kg (226 lb)    General appearance: alert, cooperative, appears stated age and no distress Resp: clear to auscultation bilaterally Cardio: regular rate and rhythm, S1, S2 normal, no murmur, click, rub or gallop GI: soft, non-tender; bowel sounds normal; no masses,  no organomegaly Extremities: Edema noted bilateral lower extremities. Limited range of motion right hip. Neurologic: No obvious focal neurological deficits.  Lab Results:  Data Reviewed: I have personally reviewed following labs and imaging  studies  CBC:  Recent Labs Lab 02/24/17 1119 02/25/17 0517 02/26/17 0705 02/27/17 0557  WBC 6.2 6.4 5.8 6.5  NEUTROABS 3.9  --   --  4.2  HGB 12.3* 12.5* 12.5* 12.8*  HCT 35.2* 35.6* 35.6* 36.3*  MCV 87.3 87.0 85.0 84.6  PLT 184 199 177 756    Basic Metabolic Panel:  Recent Labs Lab 02/26/17 1933 02/27/17 0557 02/27/17 1634 02/28/17 0526 02/28/17 1323  NA 129* 127* 127* 126* 128*  K 4.2 4.4 4.0 4.0 4.1  CL 97* 96* 94* 95* 96*  CO2 24 24 26  21* 25  GLUCOSE 103* 101* 130* 99 121*  BUN 9 9 11 11 13   CREATININE 0.56* 0.57* 0.59* 0.50* 0.53*  CALCIUM 8.4* 8.5* 8.6* 8.5* 8.4*  MG  --  1.8  --  1.8  --     GFR: Estimated Creatinine Clearance: 88.3 mL/min (A) (by C-G formula based on SCr of 0.53 mg/dL (L)).  Liver Function Tests:  Recent Labs Lab 02/24/17 1119 02/27/17 0557  AST 30 28  ALT 19 15*  ALKPHOS 61 66  BILITOT 1.3* 0.9  PROT 6.7 6.8  ALBUMIN 3.3* 3.1*    Cardiac Enzymes:  Recent Labs Lab 02/24/17 1119  CKTOTAL 35*     Radiology Studies: No results found.   Medications:  Scheduled: . enoxaparin (LOVENOX) injection  40 mg Subcutaneous Q24H  . feeding supplement (ENSURE ENLIVE)  237 mL Oral BID BM  . metoprolol tartrate  25 mg Oral BID  . multivitamin  1 tablet Oral Daily  . polyvinyl alcohol  1 drop Both Eyes BID  . sodium chloride flush  3 mL Intravenous Q12H  . sodium chloride  2 g Oral TID WC   Continuous: . sodium chloride 250 mL (02/24/17 1821)  . cefTRIAXone (ROCEPHIN)  IV Stopped (02/27/17 1725)   XQJ:JHERDE chloride, acetaminophen **OR** acetaminophen, hydrALAZINE, HYDROcodone-acetaminophen, ondansetron **OR** ondansetron (ZOFRAN) IV, sodium chloride, sodium chloride flush, zolpidem  Assessment/Plan:  Active Problems:   UTI (urinary tract infection)   Urinary tract infection    Urinary tract infection with generalized weakness Continue ceftriaxone.Last of antibiotic today.  Fall with concern for loosening of  hardware in the right hip Patient seen by orthopedics. No plans for surgery at this time. PT and OT evaluation. Pain control.  Essential hypertension. Continue to monitor closely. Continue home medications.  Hyponatremia , now severe. Serum osmolarity low, urine sodium elevated, urine osmolality normal. hypoosmolar hyponatremia. Patient clinically was volume overloaded and therefore was given IV Lasix. Sodium level worsened further. Currently on fluid restricted diet as well as on salt tablets with improvement in sodium. We'll monitor every 8 hours.  Pedal edema. Rule out DVT with Doppler.  DVT Prophylaxis: Lovenox    Code Status: Full code  Family Communication: Discussed with the patient  Disposition Plan: SNF   LOS: 3 days   Author:  Berle Mull, MD [facility] Pager: [phone] 02/28/2017 3:42 PM     If 7PM-7AM, please contact night-coverage at [email or web address], password [facility]

## 2017-02-28 NOTE — Clinical Social Work Placement (Signed)
   CLINICAL SOCIAL WORK PLACEMENT  NOTE  Date:  02/28/2017  Patient Details  Name: Danyel Griess MRN: [id number] Date of Birth: August 05, 1935  Clinical Social Work is seeking post-discharge placement for this patient at the [place] level of care (*CSW will initial, date and re-position this form in  chart as items are completed):  Yes   Patient/family provided with [place] Work Department's list of facilities offering this level of care within the geographic area requested by the patient (or if unable, by the patient's family).  Yes   Patient/family informed of their freedom to choose among providers that offer the needed level of care, that participate in Medicare, Medicaid or managed care program needed by the patient, have an available bed and are willing to accept the patient.  Yes   Patient/family informed of [place]'s ownership interest in [facility] and [facility], as well as of the fact that they are under no obligation to receive care at these facilities.  PASRR submitted to EDS on       PASRR number received on       Existing PASRR number confirmed on 02/28/17     FL2 transmitted to all facilities in geographic area requested by pt/family on       FL2 transmitted to all facilities within larger geographic area on       Patient informed that his/her managed care company has contracts with or will negotiate with certain facilities, including the following:  [facility]     Yes   Patient/family informed of bed offers received.  Patient chooses bed at [facility]     Physician recommends and patient chooses bed at      Patient to be transferred to [facility] on  .  Patient to be transferred to facility by       Patient family notified on   of transfer.  Name of family member notified:        PHYSICIAN       Additional Comment:    _______________________________________________ Lia Hopping, LCSW 02/28/2017,  1:06 PM

## 2017-02-28 NOTE — Progress Notes (Signed)
Physical Therapy Treatment Patient Details Name: David Key MRN: [id number] DOB: June 13, 1936 Today's Date: 02/28/2017    History of Present Illness Pt is a 81 y.o. male, With PMHx for hypertension and arthritis s/p surgery for his right hip around 28 years ago and status post fall with scalp laceration around 6 days ago, presenting for weakness and inability to get around at home and admitted for UTI. Pt was evaluated by orthopedics and found to have loose THR, orthopedics recommends conservative management.     PT Comments    Pt sat up at EOB and continued to work on sitting balance and strengthening of trunk musculature. Pt performed long arc quads at EOB as well as balance repetitions of trying to lean forward and steady trunk with bilat UE support by self. Pt continues to require +2 assist for bed mobility to sit at EOB.   Follow Up Recommendations  SNF     Equipment Recommendations  None recommended by PT    Recommendations for Other Services       Precautions / Restrictions Precautions Precautions: Fall Restrictions Weight Bearing Restrictions: Yes RLE Weight Bearing: Partial weight bearing RLE Partial Weight Bearing Percentage or Pounds: 50% Other Position/Activity Restrictions: 50% PWB on R Side    Mobility  Bed Mobility Overal bed mobility: Needs Assistance Bed Mobility: Supine to Sit;Sit to Supine     Supine to sit: Max assist;+2 for physical assistance;HOB elevated Sit to supine: Max assist;+2 for physical assistance   General bed mobility comments: Pt requires +2 for trunk and RLE to sit upright.  continues to have significant posterior leaning  Transfers                 General transfer comment: Unable to perform at this time   Ambulation/Gait             General Gait Details: Not attempted at this time    Stairs            Wheelchair Mobility    Modified Rankin (Stroke Patients Only)       Balance Overall balance assessment:  Needs assistance Sitting-balance support: Feet supported;Bilateral upper extremity supported Sitting balance-Leahy Scale: Poor Sitting balance - Comments: can briefly maintain balance with bil UE support and feet supported but needs 2 assist (one to block knees due to forward sliding and one to monitor/assist with trunk control).  Mostly mod -max A to support self.   Pt only able to use UE support to maintain upright position for approx 10 seconds, performed posture control without support x10 Postural control: Posterior lean                                  Cognition Arousal/Alertness: Awake/alert Behavior During Therapy: WFL for tasks assessed/performed Overall Cognitive Status: Within Functional Limits for tasks assessed                                        Exercises Total Joint Exercises Long Arc Quad: 10 reps;AROM;Both;Seated    General Comments        Pertinent Vitals/Pain Pain Assessment: No/denies pain    Home Living                      Prior Function  PT Goals (current goals can now be found in the care plan section) Progress towards PT goals: Progressing toward goals    Frequency    Min 3X/week      PT Plan Current plan remains appropriate    Co-evaluation              AM-PAC PT "6 Clicks" Daily Activity  Outcome Measure  Difficulty turning over in bed (including adjusting bedclothes, sheets and blankets)?: Unable Difficulty moving from lying on back to sitting on the side of the bed? : Unable Difficulty sitting down on and standing up from a chair with arms (e.g., wheelchair, bedside commode, etc,.)?: Unable Help needed moving to and from a bed to chair (including a wheelchair)?: Total Help needed walking in hospital room?: Total Help needed climbing 3-5 steps with a railing? : Total 6 Click Score: 6    End of Session   Activity Tolerance: Patient tolerated treatment well Patient left: in  bed;with call bell/phone within reach;with bed alarm set   PT Visit Diagnosis: Muscle weakness (generalized) (M62.81);Difficulty in walking, not elsewhere classified (R26.2)     Time: 1771-1657 PT Time Calculation (min) (ACUTE ONLY): 23 min  Charges:  $Therapeutic Activity: 23-37 mins                    G Codes:       Olegario Shearer, SPT    Reino Bellis 02/28/2017, 1:28 PM

## 2017-02-28 NOTE — Progress Notes (Signed)
5 small staples removed on patient's scalp per MD order.

## 2017-02-28 NOTE — Care Management Note (Signed)
Case Management Note  Patient Details  Name: David Key MRN: [id number] Date of Birth: 1935-09-16  Subjective/Objective:     Hyponatremia na -[id number]/               Action/Plan: Date:  February 28, 2017 Chart reviewed for concurrent status and case management needs. Will continue to follow patient progress. Discharge Planning: following for needs Expected discharge date: [id number] Velva Harman, BSN, [place], [place]  Expected Discharge Date:                  Expected Discharge Plan:  [place]  In-House Referral:  Clinical Social Work  Discharge planning Services  CM Consult  Post Acute Care Choice:  Home Health Choice offered to:  Patient  DME Arranged:    DME Agency:     HH Arranged:    [place] Agency:     Status of Service:  In process, will continue to follow  If discussed at Long Length of Stay Meetings, dates discussed:    Additional Comments:  Leeroy Cha, RN 02/28/2017, 8:41 AM

## 2017-03-01 DIAGNOSIS — K5900 Constipation, unspecified: Secondary | ICD-10-CM | POA: Diagnosis not present

## 2017-03-01 DIAGNOSIS — E860 Dehydration: Secondary | ICD-10-CM | POA: Diagnosis not present

## 2017-03-01 DIAGNOSIS — Z9181 History of falling: Secondary | ICD-10-CM | POA: Diagnosis not present

## 2017-03-01 DIAGNOSIS — R21 Rash and other nonspecific skin eruption: Secondary | ICD-10-CM | POA: Diagnosis not present

## 2017-03-01 DIAGNOSIS — R54 Age-related physical debility: Secondary | ICD-10-CM | POA: Diagnosis not present

## 2017-03-01 DIAGNOSIS — E871 Hypo-osmolality and hyponatremia: Secondary | ICD-10-CM

## 2017-03-01 DIAGNOSIS — J3089 Other allergic rhinitis: Secondary | ICD-10-CM | POA: Diagnosis not present

## 2017-03-01 DIAGNOSIS — I10 Essential (primary) hypertension: Secondary | ICD-10-CM | POA: Diagnosis not present

## 2017-03-01 DIAGNOSIS — R52 Pain, unspecified: Secondary | ICD-10-CM | POA: Diagnosis not present

## 2017-03-01 DIAGNOSIS — S0990XA Unspecified injury of head, initial encounter: Secondary | ICD-10-CM | POA: Diagnosis not present

## 2017-03-01 DIAGNOSIS — N390 Urinary tract infection, site not specified: Secondary | ICD-10-CM | POA: Diagnosis not present

## 2017-03-01 DIAGNOSIS — R0981 Nasal congestion: Secondary | ICD-10-CM | POA: Diagnosis not present

## 2017-03-01 DIAGNOSIS — Z23 Encounter for immunization: Secondary | ICD-10-CM | POA: Diagnosis not present

## 2017-03-01 DIAGNOSIS — E569 Vitamin deficiency, unspecified: Secondary | ICD-10-CM | POA: Diagnosis not present

## 2017-03-01 DIAGNOSIS — E440 Moderate protein-calorie malnutrition: Secondary | ICD-10-CM | POA: Diagnosis not present

## 2017-03-01 DIAGNOSIS — M25551 Pain in right hip: Secondary | ICD-10-CM | POA: Diagnosis not present

## 2017-03-01 DIAGNOSIS — S0101XD Laceration without foreign body of scalp, subsequent encounter: Secondary | ICD-10-CM | POA: Diagnosis not present

## 2017-03-01 DIAGNOSIS — Z111 Encounter for screening for respiratory tuberculosis: Secondary | ICD-10-CM | POA: Diagnosis not present

## 2017-03-01 DIAGNOSIS — J309 Allergic rhinitis, unspecified: Secondary | ICD-10-CM | POA: Diagnosis not present

## 2017-03-01 DIAGNOSIS — S79911A Unspecified injury of right hip, initial encounter: Secondary | ICD-10-CM | POA: Diagnosis not present

## 2017-03-01 DIAGNOSIS — K591 Functional diarrhea: Secondary | ICD-10-CM | POA: Diagnosis not present

## 2017-03-01 DIAGNOSIS — R5381 Other malaise: Secondary | ICD-10-CM | POA: Diagnosis not present

## 2017-03-01 DIAGNOSIS — H04129 Dry eye syndrome of unspecified lacrimal gland: Secondary | ICD-10-CM | POA: Diagnosis not present

## 2017-03-01 DIAGNOSIS — M6281 Muscle weakness (generalized): Secondary | ICD-10-CM | POA: Diagnosis not present

## 2017-03-01 LAB — BASIC METABOLIC PANEL
Anion gap: 8 (ref 5–15)
Anion gap: 7 (ref 5–15)
BUN: 10 mg/dL (ref 6–20)
BUN: 11 mg/dL (ref 6–20)
CHLORIDE: 97 mmol/L — AB (ref 101–111)
CHLORIDE: 99 mmol/L — AB (ref 101–111)
CO2: 23 mmol/L (ref 22–32)
CO2: 24 mmol/L (ref 22–32)
CREATININE: 0.53 mg/dL — AB (ref 0.61–1.24)
CREATININE: 0.56 mg/dL — AB (ref 0.61–1.24)
Calcium: 8.5 mg/dL — ABNORMAL LOW (ref 8.9–10.3)
Calcium: 8.4 mg/dL — ABNORMAL LOW (ref 8.9–10.3)
GFR calc Af Amer: >60 mL/min (ref >60)
GFR calc Af Amer: >60 mL/min (ref >60)
GFR calc non Af Amer: >60 mL/min (ref >60)
GFR calc non Af Amer: >60 mL/min (ref >60)
GLUCOSE: 127 mg/dL — AB (ref 65–99)
Glucose, Bld: 108 mg/dL — ABNORMAL HIGH (ref 65–99)
Potassium: 4 mmol/L (ref 3.5–5.1)
Potassium: 4.1 mmol/L (ref 3.5–5.1)
SODIUM: 128 mmol/L — AB (ref 135–145)
SODIUM: 130 mmol/L — AB (ref 135–145)

## 2017-03-01 MED ORDER — SODIUM CHLORIDE 1 G PO TABS: 1.0000 g | ORAL_TABLET | Freq: Three times a day (TID) | ORAL | 0 refills | Status: AC

## 2017-03-01 MED ORDER — METOPROLOL TARTRATE 25 MG PO TABS: 25.0000 mg | ORAL_TABLET | Freq: Two times a day (BID) | ORAL | 0 refills | Status: AC

## 2017-03-01 MED ORDER — ENSURE ENLIVE PO LIQD: 237.0000 mL | Freq: Two times a day (BID) | ORAL | 12 refills | Status: AC

## 2017-03-01 MED ORDER — SODIUM CHLORIDE 1 G PO TABS: 1.0000 g | ORAL_TABLET | Freq: Three times a day (TID) | ORAL | 0 refills | Status: DC

## 2017-03-01 NOTE — Clinical Social Work Placement (Signed)
   CLINICAL SOCIAL WORK PLACEMENT  NOTE  Date:  03/01/2017  Patient Details  Name: David Key MRN: [id number] Date of Birth: April 01, 1936  Clinical Social Work is seeking post-discharge placement for this patient at the [place] level of care (*CSW will initial, date and re-position this form in  chart as items are completed):  Yes   Patient/family provided with [place] Work Department's list of facilities offering this level of care within the geographic area requested by the patient (or if unable, by the patient's family).  Yes   Patient/family informed of their freedom to choose among providers that offer the needed level of care, that participate in Medicare, Medicaid or managed care program needed by the patient, have an available bed and are willing to accept the patient.  Yes   Patient/family informed of [place]'s ownership interest in [facility] and [facility], as well as of the fact that they are under no obligation to receive care at these facilities.  PASRR submitted to EDS on       PASRR number received on       Existing PASRR number confirmed on 02/28/17     FL2 transmitted to all facilities in geographic area requested by pt/family on       FL2 transmitted to all facilities within larger geographic area on       Patient informed that his/her managed care company has contracts with or will negotiate with certain facilities, including the following:  [facility]     Yes   Patient/family informed of bed offers received.  Patient chooses bed at [facility]     Physician recommends and patient chooses bed at      Patient to be transferred to [facility] on 03/01/17.  Patient to be transferred to facility by ptar     Patient family notified on 03/01/17 of transfer.  Name of family member notified:  Jeanie     PHYSICIAN Please sign FL2     Additional Comment:     _______________________________________________ Jorge Ny, LCSW 03/01/2017, 1:38 PM

## 2017-03-01 NOTE — Discharge Summary (Signed)
[facility] Discharge Summary   Patient: David Key FAO:[id number]   PCP: Wenda Low, MD DOB: 1936-01-25   Date of admission: 02/24/2017   Date of discharge:  03/01/2017    Discharge Diagnoses:  Principal Problem:   Hyponatremia Active Problems:   Chronic venous stasis dermatitis   HTN (hypertension)   UTI (urinary tract infection)  Admitted From: home Disposition:  SNF  Recommendations for Outpatient Follow-up:  1. Please follow up with PCP in 1 week and get BMP done next week    Contact information for follow-up providers    Wenda Low, MD. Schedule an appointment as soon as possible for a visit in 1 week(s).   Specialty:  Internal Medicine Why:  repeat BMP next week.  Contact information: [address] [place] [address] [phone]        Gaynelle Arabian, MD. Schedule an appointment as soon as possible for a visit in 1 month(s).   Specialty:  Orthopedic Surgery Contact information: [address] [place] [address] [phone]            Contact information for after-discharge care    Destination    [facility] Follow up.   Specialty:  [place] information: [address] [place] [phone]                 Diet recommendation: regular diet  Activity: The patient is advised to gradually reintroduce usual activities.  Discharge Condition: good  Code Status: full code  History of present illness: As per the H and P dictated on admission, "David Key  is a 81 y.o. male, With past medical history significant for hypertension and arthritis status post surgery for his right hip around 28 years ago and status post fall with scalp laceration around 6 days ago, presenting for weakness and inability to get around at home. Patient denies any chest pains, shortness of breath, nausea vomiting or diarrhea "He is too weak"and not able to take care  of himself anymore. His sister is a help at home. Right hip x-ray showed mild loosening and Dr. Stann Mainland will see patient for further management"  Hospital Course:  Summary of his active problems in the hospital is as following. Urinary tract infection with generalized weakness Treated with IV ceftriaxone for 5 days.  Urine culture negative. No fever or chills.   Fall with concern for loosening of hardware in the right hip Patient seen by orthopedics. No plans for surgery at this time. PT and OT evaluation. Pain control. Outpatient follow up with orthopedics.   Essential hypertension. Continue to monitor closely. Continue home medications. Lopressor added this admission.   Hyponatremia , severe. Serum osmolarity low, urine sodium elevated, urine osmolality normal. hypoosmolar hyponatremia. Patient clinically was volume overloaded and therefore was given IV Lasix. Sodium level worsened further. Better on fluid restricted diet as well as on salt tablets with improvement in sodium. Will continue on discharge with recommendation of repeating BMP on Monday. Follow up with PCP.  Pedal edema. Ruled out DVT with Doppler.  All other chronic medical condition were stable during the hospitalization.  Patient was seen by physical therapy, who recommended SNF, which was arranged by Education officer, museum and case Freight forwarder. On the day of the discharge the patient's vitals were stable, and no other acute medical condition were reported by patient. the patient was felt safe to be discharge at SNF with therapy.  Procedures and Results:  none  Consultations:  Orthopedics  DISCHARGE MEDICATION: Current Discharge Medication List    START taking these medications   Details  feeding supplement, ENSURE ENLIVE, (ENSURE ENLIVE) LIQD Take 237 mLs by mouth 2 (two) times daily between meals. Qty: 237 mL, Refills: 12    metoprolol tartrate (LOPRESSOR) 25 MG tablet Take 1 tablet (25 mg total) by mouth 2  (two) times daily. Qty: 60 tablet, Refills: 0    sodium chloride 1 g tablet Take 1 tablet (1 g total) by mouth 3 (three) times daily with meals. Qty: 6 tablet, Refills: 0      CONTINUE these medications which have NOT CHANGED   Details  benazepril (LOTENSIN) 10 MG tablet Take 10 mg by mouth daily.    cholecalciferol (VITAMIN D) 1000 units tablet Take 2,000 Units by mouth daily.    furosemide (LASIX) 20 MG tablet Take 20 mg by mouth daily.    Multiple Vitamins-Minerals (ICAPS PO) Take 1 capsule by mouth daily.    Propylene Glycol (SYSTANE BALANCE) 0.6 % SOLN Place 1 drop into both eyes 2 (two) times daily.    sodium chloride (OCEAN) 0.65 % SOLN nasal spray Place 1 spray into both nostrils as needed for congestion.       Allergies  Allergen Reactions  . Penicillins Hives    Has patient had a PCN reaction causing immediate rash, facial/tongue/throat swelling, SOB or lightheadedness with hypotension: Yes Has patient had a PCN reaction causing severe rash involving mucus membranes or skin necrosis: Yes Has patient had a PCN reaction that required hospitalization: Yes, was already in hospital when reaction happened Has patient had a PCN reaction occurring within the last 10 years: No If all of the above answers are "NO", then may proceed with Cephalosporin use.    Discharge Instructions    Diet general    Complete by:  As directed    Fluid restriction of 1800 cc/24 hours   Discharge instructions    Complete by:  As directed    It is important that you read following instructions as well as go over your medication list with RN to help you understand your care after this hospitalization.  Discharge Instructions: Please follow-up with PCP in one week  Please request your primary care physician to go over all Hospital Tests and Procedure/Radiological results at the follow up,  Please get all Hospital records sent to your PCP by signing hospital release before you go home.   Do  not take more than prescribed Pain, Sleep and Anxiety Medications. You were cared for by a hospitalist during your hospital stay. If you have any questions about your discharge medications or the care you received while you were in the hospital after you are discharged, you can call the unit and ask to speak with the hospitalist on call if the hospitalist that took care of you is not available.  Once you are discharged, your primary care physician will handle any further medical issues. Please note that NO REFILLS for any discharge medications will be authorized once you are discharged, as it is imperative that you return to your primary care physician (or establish a relationship with a primary care physician if you do not have one) for your aftercare needs so that they can reassess your need for medications and monitor your lab values. You Must read complete instructions/literature along with all the possible adverse reactions/side effects for all the Medicines you take and that have been prescribed to you. Take any new Medicines after  you have completely understood and accept all the possible adverse reactions/side effects. Wear Seat belts while driving. If you have smoked or chewed Tobacco in the last 2 yrs please stop smoking and/or stop any Recreational drug use.   Increase activity slowly    Complete by:  As directed    Partial weight bearing    Complete by:  As directed    % Body Weight:  50% weightbearing to the right lower extremity with a walker at all times   Laterality:  right   Extremity:  Lower     Discharge Exam: Filed Weights   02/27/17 1000 02/28/17 1448 03/01/17 0500  Weight: 103.2 kg (227 lb 8 oz) 102.5 kg (226 lb) 102.1 kg (225 lb)   Vitals:   02/28/17 2139 03/01/17 0546  BP: 129/71 132/77  Pulse: 97 94  Resp: 18 18  Temp: 98 F (36.7 C) 98.3 F (36.8 C)  SpO2: 94% 93%   General: Appear in no distress, no Rash; Oral Mucosa moist Cardiovascular: S1 and S2 Present,  no Murmur, no JVD Respiratory: Bilateral Air entry present and Clear to Auscultation, no Crackles, no wheezes Abdomen: Bowel Sound present, Soft and no tenderness Extremities: no Pedal edema, no calf tenderness Neurology: Grossly no focal neuro deficit.  The results of significant diagnostics from this hospitalization (including imaging, microbiology, ancillary and laboratory) are listed below for reference.    Significant Diagnostic Studies: Ct Head Wo Contrast  Result Date: 02/19/2017 CLINICAL DATA:  Status post fall, with laceration at the right vertex and chronic neck pain. Initial encounter. EXAM: CT HEAD WITHOUT CONTRAST CT CERVICAL SPINE WITHOUT CONTRAST TECHNIQUE: Multidetector CT imaging of the head and cervical spine was performed following the standard protocol without intravenous contrast. Multiplanar CT image reconstructions of the cervical spine were also generated. COMPARISON:  MRI of the cervical spine performed 02/25/2007 FINDINGS: CT HEAD FINDINGS Brain: No evidence of acute infarction, hemorrhage, hydrocephalus, extra-axial collection or mass lesion/mass effect. Prominence of the ventricles and sulci reflects moderate cortical volume loss. Cerebellar atrophy is noted. Scattered periventricular white matter change likely reflects small vessel ischemic microangiopathy. The brainstem and fourth ventricle are within normal limits. The basal ganglia are unremarkable in appearance. The cerebral hemispheres demonstrate grossly normal gray-white differentiation. No mass effect or midline shift is seen. Vascular: No hyperdense vessel or unexpected calcification. Skull: There is no evidence of fracture; visualized osseous structures are unremarkable in appearance. Sinuses/Orbits: The orbits are within normal limits. The paranasal sinuses and mastoid air cells are well-aerated. Other: A soft tissue laceration is noted at the right vertex. CT CERVICAL SPINE FINDINGS Alignment: Normal. Skull base and  vertebrae: No acute fracture. No primary bone lesion or focal pathologic process. Soft tissues and spinal canal: No prevertebral fluid or swelling. No visible canal hematoma. Disc levels: Anterior bridging osteophytes are noted along the cervical spine. Upper chest: Calcification is noted at the carotid bifurcations bilaterally. The minimally visualized lung apices are clear. Other: No additional soft tissue abnormalities are seen. IMPRESSION: 1. No evidence of traumatic intracranial injury or fracture. 2. No evidence of acute fracture or subluxation along the cervical spine. 3. Soft tissue laceration at the right vertex. 4. Moderate cortical volume loss and scattered small vessel ischemic microangiopathy. 5. Calcification at the carotid bifurcations bilaterally. Carotid ultrasound would be helpful for further evaluation, when and as deemed clinically appropriate. 6. Diffuse anterior bridging osteophytes along the cervical spine. Electronically Signed   By: Garald Balding M.D.   On: 02/19/2017 03:21  Ct Cervical Spine Wo Contrast  Result Date: 02/19/2017 CLINICAL DATA:  Status post fall, with laceration at the right vertex and chronic neck pain. Initial encounter. EXAM: CT HEAD WITHOUT CONTRAST CT CERVICAL SPINE WITHOUT CONTRAST TECHNIQUE: Multidetector CT imaging of the head and cervical spine was performed following the standard protocol without intravenous contrast. Multiplanar CT image reconstructions of the cervical spine were also generated. COMPARISON:  MRI of the cervical spine performed 02/25/2007 FINDINGS: CT HEAD FINDINGS Brain: No evidence of acute infarction, hemorrhage, hydrocephalus, extra-axial collection or mass lesion/mass effect. Prominence of the ventricles and sulci reflects moderate cortical volume loss. Cerebellar atrophy is noted. Scattered periventricular white matter change likely reflects small vessel ischemic microangiopathy. The brainstem and fourth ventricle are within normal  limits. The basal ganglia are unremarkable in appearance. The cerebral hemispheres demonstrate grossly normal gray-white differentiation. No mass effect or midline shift is seen. Vascular: No hyperdense vessel or unexpected calcification. Skull: There is no evidence of fracture; visualized osseous structures are unremarkable in appearance. Sinuses/Orbits: The orbits are within normal limits. The paranasal sinuses and mastoid air cells are well-aerated. Other: A soft tissue laceration is noted at the right vertex. CT CERVICAL SPINE FINDINGS Alignment: Normal. Skull base and vertebrae: No acute fracture. No primary bone lesion or focal pathologic process. Soft tissues and spinal canal: No prevertebral fluid or swelling. No visible canal hematoma. Disc levels: Anterior bridging osteophytes are noted along the cervical spine. Upper chest: Calcification is noted at the carotid bifurcations bilaterally. The minimally visualized lung apices are clear. Other: No additional soft tissue abnormalities are seen. IMPRESSION: 1. No evidence of traumatic intracranial injury or fracture. 2. No evidence of acute fracture or subluxation along the cervical spine. 3. Soft tissue laceration at the right vertex. 4. Moderate cortical volume loss and scattered small vessel ischemic microangiopathy. 5. Calcification at the carotid bifurcations bilaterally. Carotid ultrasound would be helpful for further evaluation, when and as deemed clinically appropriate. 6. Diffuse anterior bridging osteophytes along the cervical spine. Electronically Signed   By: Garald Balding M.D.   On: 02/19/2017 03:21   Dg Knee Complete 4 Views Right  Result Date: 02/24/2017 CLINICAL DATA:  Right knee pain since a fall 6 days ago. EXAM: RIGHT KNEE - COMPLETE 4+ VIEW COMPARISON:  08/20/2009 FINDINGS: No evidence of fracture, dislocation, or joint effusion. Tiny marginal osteophytes on the patella. Soft tissues are unremarkable. IMPRESSION: No acute abnormality.   No significant change since 2011. Electronically Signed   By: Lorriane Shire M.D.   On: 02/24/2017 11:08   Dg Hip Unilat W Or Wo Pelvis 2-3 Views Right  Result Date: 02/24/2017 CLINICAL DATA:  Bilateral hip pain, right greater than left since a fall 6 days ago. EXAM: DG HIP (WITH OR WITHOUT PELVIS) 2-3V RIGHT COMPARISON:  Radiographs dated 08/20/2009 FINDINGS: FINDINGS There is no apparent fracture or dislocation. There is new lucency around the distal tip of the right femoral component with slight migration of the tip laterally. The left femoral component demonstrates no evidence of loosening. The acetabular components are unchanged since the prior study of 2011. No visible fractures.  Radioactive seeds in the prostate gland. IMPRESSION: 1. No fractures. 2. 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 Electronically Signed   By: Lorriane Shire M.D.   On: 02/24/2017 11:06    Microbiology: Recent Results (from the past 240 hour(s))  Urine culture     Status: Abnormal   Collection Time: 02/24/17 12:18 PM  Result Value Ref Range Status   Specimen Description URINE, CLEAN CATCH  Final   Special Requests NONE  Final   Culture MULTIPLE SPECIES PRESENT, SUGGEST RECOLLECTION (A)  Final   Report Status 02/26/2017 FINAL  Final  Urine Culture     Status: None   Collection Time: 02/27/17 10:50 AM  Result Value Ref Range Status   Specimen Description URINE, RANDOM  Final   Special Requests NONE  Final   Culture   Final    NO GROWTH Performed at [facility], [address], [place],  [address]    Report Status 02/28/2017 FINAL  Final     Labs: CBC:  Recent Labs Lab 02/24/17 1119 02/25/17 0517 02/26/17 0705 02/27/17 0557  WBC 6.2 6.4 5.8 6.5  NEUTROABS 3.9  --   --  4.2  HGB 12.3* 12.5* 12.5* 12.8*  HCT 35.2* 35.6* 35.6* 36.3*  MCV 87.3 87.0 85.0 84.6  PLT 184 199 177 286   Basic Metabolic Panel:  Recent Labs Lab 02/27/17 0557  02/27/17 1634 02/28/17 0526 02/28/17 1323 02/28/17 2113 03/01/17 0541  NA 127* 127* 126* 128* 130* 128*  K 4.4 4.0 4.0 4.1 4.1 4.0  CL 96* 94* 95* 96* 99* 97*  CO2 24 26 21* 25 23 23   GLUCOSE 101* 130* 99 121* 121* 108*  BUN 9 11 11 13 12 10   CREATININE 0.57* 0.59* 0.50* 0.53* 0.53* 0.53*  CALCIUM 8.5* 8.6* 8.5* 8.4* 8.6* 8.5*  MG 1.8  --  1.8  --   --   --    Liver Function Tests:  Recent Labs Lab 02/24/17 1119 02/27/17 0557  AST 30 28  ALT 19 15*  ALKPHOS 61 66  BILITOT 1.3* 0.9  PROT 6.7 6.8  ALBUMIN 3.3* 3.1*   No results for input(s): LIPASE, AMYLASE in the last 168 hours. No results for input(s): AMMONIA in the last 168 hours. Cardiac Enzymes:  Recent Labs Lab 02/24/17 1119  CKTOTAL 35*   BNP (last 3 results) No results for input(s): BNP in the last 8760 hours. CBG: No results for input(s): GLUCAP in the last 168 hours. Time spent: 35 minutes  Signed:  Nansi Birmingham  [facility]  03/01/2017  , 1:14 PM

## 2017-03-01 NOTE — Progress Notes (Signed)
Pt leaving this afternoon with EMS headed to [facility]. Pt alert and oriented; aware of transfer. SW spoke to family.

## 2017-03-01 NOTE — Progress Notes (Signed)
No change from earlier shift asst. Will c/t monitor.

## 2017-03-01 NOTE — Progress Notes (Signed)
Patient will discharge to [facility] Anticipated discharge date: 8/18 Family notified: Noreene Larsson- left message Transportation by [facility]- called at 1:35pm  CSW signing off.  Jorge Ny, LCSW Clinical Social Worker [phone]

## 2017-03-01 NOTE — Progress Notes (Signed)
Pt left at this time with EMS, headed to [facility].

## 2017-03-01 NOTE — Progress Notes (Signed)
Writer called [facility] SNF and gave report to receiving nurse. Pt to leave with transport this afternoon.

## 2017-03-02 DIAGNOSIS — I10 Essential (primary) hypertension: Secondary | ICD-10-CM | POA: Diagnosis not present

## 2017-03-02 DIAGNOSIS — N390 Urinary tract infection, site not specified: Secondary | ICD-10-CM | POA: Diagnosis not present

## 2017-03-02 DIAGNOSIS — E871 Hypo-osmolality and hyponatremia: Secondary | ICD-10-CM | POA: Diagnosis not present

## 2017-03-02 DIAGNOSIS — R5381 Other malaise: Secondary | ICD-10-CM | POA: Diagnosis not present

## 2017-03-05 DIAGNOSIS — R21 Rash and other nonspecific skin eruption: Secondary | ICD-10-CM | POA: Diagnosis not present

## 2017-03-26 DIAGNOSIS — E871 Hypo-osmolality and hyponatremia: Secondary | ICD-10-CM | POA: Diagnosis not present

## 2017-03-26 DIAGNOSIS — R21 Rash and other nonspecific skin eruption: Secondary | ICD-10-CM | POA: Diagnosis not present

## 2017-03-26 DIAGNOSIS — M25551 Pain in right hip: Secondary | ICD-10-CM | POA: Diagnosis not present

## 2017-03-26 DIAGNOSIS — R54 Age-related physical debility: Secondary | ICD-10-CM | POA: Diagnosis not present

## 2017-03-26 DIAGNOSIS — I10 Essential (primary) hypertension: Secondary | ICD-10-CM | POA: Diagnosis not present

## 2017-03-26 DIAGNOSIS — E440 Moderate protein-calorie malnutrition: Secondary | ICD-10-CM | POA: Diagnosis not present

## 2017-03-26 DIAGNOSIS — E569 Vitamin deficiency, unspecified: Secondary | ICD-10-CM | POA: Diagnosis not present

## 2017-03-26 DIAGNOSIS — H04129 Dry eye syndrome of unspecified lacrimal gland: Secondary | ICD-10-CM | POA: Diagnosis not present

## 2017-03-28 ENCOUNTER — Other Ambulatory Visit (HOSPITAL_COMMUNITY): Payer: Self-pay | Admitting: Orthopedic Surgery

## 2017-03-28 DIAGNOSIS — M25551 Pain in right hip: Secondary | ICD-10-CM

## 2017-04-02 DIAGNOSIS — E871 Hypo-osmolality and hyponatremia: Secondary | ICD-10-CM | POA: Diagnosis not present

## 2017-04-02 DIAGNOSIS — I10 Essential (primary) hypertension: Secondary | ICD-10-CM | POA: Diagnosis not present

## 2017-04-03 ENCOUNTER — Ambulatory Visit (HOSPITAL_COMMUNITY)
Admission: RE | Admit: 2017-04-03 | Discharge: 2017-04-03 | Disposition: A | Discharge status: Home / Self Care | Payer: No Typology Code available for payment source | Source: Ambulatory Visit | Attending: Orthopedic Surgery | Admitting: Orthopedic Surgery

## 2017-04-03 ENCOUNTER — Encounter (HOSPITAL_COMMUNITY)
Admission: RE | Admit: 2017-04-03 | Discharge: 2017-04-03 | Disposition: A | Discharge status: Home / Self Care | Payer: No Typology Code available for payment source | Source: Ambulatory Visit | Attending: Orthopedic Surgery | Admitting: Orthopedic Surgery

## 2017-04-03 DIAGNOSIS — S79911A Unspecified injury of right hip, initial encounter: Secondary | ICD-10-CM | POA: Diagnosis not present

## 2017-04-03 DIAGNOSIS — M25551 Pain in right hip: Secondary | ICD-10-CM | POA: Diagnosis not present

## 2017-04-03 MED ORDER — TECHNETIUM TC 99M MEDRONATE IV KIT
19.1000 | PACK | Freq: Once | INTRAVENOUS | Status: AC | PRN
  Administered 2017-04-03: 19.1 via INTRAVENOUS

## 2017-04-07 DIAGNOSIS — E871 Hypo-osmolality and hyponatremia: Secondary | ICD-10-CM | POA: Diagnosis not present

## 2017-04-07 DIAGNOSIS — I10 Essential (primary) hypertension: Secondary | ICD-10-CM | POA: Diagnosis not present

## 2017-04-10 DIAGNOSIS — I10 Essential (primary) hypertension: Secondary | ICD-10-CM | POA: Diagnosis not present

## 2017-04-10 DIAGNOSIS — E871 Hypo-osmolality and hyponatremia: Secondary | ICD-10-CM | POA: Diagnosis not present

## 2017-04-10 DIAGNOSIS — R54 Age-related physical debility: Secondary | ICD-10-CM | POA: Diagnosis not present

## 2017-04-10 DIAGNOSIS — H04129 Dry eye syndrome of unspecified lacrimal gland: Secondary | ICD-10-CM | POA: Diagnosis not present

## 2017-04-10 DIAGNOSIS — E569 Vitamin deficiency, unspecified: Secondary | ICD-10-CM | POA: Diagnosis not present

## 2017-04-10 DIAGNOSIS — M6281 Muscle weakness (generalized): Secondary | ICD-10-CM | POA: Diagnosis not present

## 2017-04-10 DIAGNOSIS — E440 Moderate protein-calorie malnutrition: Secondary | ICD-10-CM | POA: Diagnosis not present

## 2017-04-10 DIAGNOSIS — R21 Rash and other nonspecific skin eruption: Secondary | ICD-10-CM | POA: Diagnosis not present

## 2017-04-10 DIAGNOSIS — J309 Allergic rhinitis, unspecified: Secondary | ICD-10-CM | POA: Diagnosis not present

## 2017-04-16 DIAGNOSIS — T84030D Mechanical loosening of internal right hip prosthetic joint, subsequent encounter: Secondary | ICD-10-CM | POA: Diagnosis not present

## 2017-04-16 DIAGNOSIS — Z87440 Personal history of urinary (tract) infections: Secondary | ICD-10-CM | POA: Diagnosis not present

## 2017-04-16 DIAGNOSIS — E440 Moderate protein-calorie malnutrition: Secondary | ICD-10-CM | POA: Diagnosis not present

## 2017-04-16 DIAGNOSIS — I10 Essential (primary) hypertension: Secondary | ICD-10-CM | POA: Diagnosis not present

## 2017-04-16 DIAGNOSIS — Z96641 Presence of right artificial hip joint: Secondary | ICD-10-CM | POA: Diagnosis not present

## 2017-04-16 DIAGNOSIS — I872 Venous insufficiency (chronic) (peripheral): Secondary | ICD-10-CM | POA: Diagnosis not present

## 2017-04-18 DIAGNOSIS — E440 Moderate protein-calorie malnutrition: Secondary | ICD-10-CM | POA: Diagnosis not present

## 2017-04-18 DIAGNOSIS — I872 Venous insufficiency (chronic) (peripheral): Secondary | ICD-10-CM | POA: Diagnosis not present

## 2017-04-18 DIAGNOSIS — T84030D Mechanical loosening of internal right hip prosthetic joint, subsequent encounter: Secondary | ICD-10-CM | POA: Diagnosis not present

## 2017-04-18 DIAGNOSIS — Z87440 Personal history of urinary (tract) infections: Secondary | ICD-10-CM | POA: Diagnosis not present

## 2017-04-18 DIAGNOSIS — Z96641 Presence of right artificial hip joint: Secondary | ICD-10-CM | POA: Diagnosis not present

## 2017-04-18 DIAGNOSIS — I10 Essential (primary) hypertension: Secondary | ICD-10-CM | POA: Diagnosis not present

## 2017-04-22 DIAGNOSIS — I10 Essential (primary) hypertension: Secondary | ICD-10-CM | POA: Diagnosis not present

## 2017-04-22 DIAGNOSIS — I872 Venous insufficiency (chronic) (peripheral): Secondary | ICD-10-CM | POA: Diagnosis not present

## 2017-04-22 DIAGNOSIS — E440 Moderate protein-calorie malnutrition: Secondary | ICD-10-CM | POA: Diagnosis not present

## 2017-04-22 DIAGNOSIS — Z87440 Personal history of urinary (tract) infections: Secondary | ICD-10-CM | POA: Diagnosis not present

## 2017-04-22 DIAGNOSIS — Z96641 Presence of right artificial hip joint: Secondary | ICD-10-CM | POA: Diagnosis not present

## 2017-04-22 DIAGNOSIS — T84030D Mechanical loosening of internal right hip prosthetic joint, subsequent encounter: Secondary | ICD-10-CM | POA: Diagnosis not present

## 2017-04-24 DIAGNOSIS — I872 Venous insufficiency (chronic) (peripheral): Secondary | ICD-10-CM | POA: Diagnosis not present

## 2017-04-24 DIAGNOSIS — Z96641 Presence of right artificial hip joint: Secondary | ICD-10-CM | POA: Diagnosis not present

## 2017-04-24 DIAGNOSIS — Z87440 Personal history of urinary (tract) infections: Secondary | ICD-10-CM | POA: Diagnosis not present

## 2017-04-24 DIAGNOSIS — I10 Essential (primary) hypertension: Secondary | ICD-10-CM | POA: Diagnosis not present

## 2017-04-24 DIAGNOSIS — T84030D Mechanical loosening of internal right hip prosthetic joint, subsequent encounter: Secondary | ICD-10-CM | POA: Diagnosis not present

## 2017-04-24 DIAGNOSIS — E440 Moderate protein-calorie malnutrition: Secondary | ICD-10-CM | POA: Diagnosis not present

## 2017-04-25 DIAGNOSIS — I10 Essential (primary) hypertension: Secondary | ICD-10-CM | POA: Diagnosis not present

## 2017-04-25 DIAGNOSIS — Z96641 Presence of right artificial hip joint: Secondary | ICD-10-CM | POA: Diagnosis not present

## 2017-04-25 DIAGNOSIS — T84030D Mechanical loosening of internal right hip prosthetic joint, subsequent encounter: Secondary | ICD-10-CM | POA: Diagnosis not present

## 2017-04-25 DIAGNOSIS — E440 Moderate protein-calorie malnutrition: Secondary | ICD-10-CM | POA: Diagnosis not present

## 2017-04-25 DIAGNOSIS — Z87440 Personal history of urinary (tract) infections: Secondary | ICD-10-CM | POA: Diagnosis not present

## 2017-04-25 DIAGNOSIS — I872 Venous insufficiency (chronic) (peripheral): Secondary | ICD-10-CM | POA: Diagnosis not present

## 2017-04-30 DIAGNOSIS — I872 Venous insufficiency (chronic) (peripheral): Secondary | ICD-10-CM | POA: Diagnosis not present

## 2017-04-30 DIAGNOSIS — E440 Moderate protein-calorie malnutrition: Secondary | ICD-10-CM | POA: Diagnosis not present

## 2017-04-30 DIAGNOSIS — I10 Essential (primary) hypertension: Secondary | ICD-10-CM | POA: Diagnosis not present

## 2017-04-30 DIAGNOSIS — T84030D Mechanical loosening of internal right hip prosthetic joint, subsequent encounter: Secondary | ICD-10-CM | POA: Diagnosis not present

## 2017-04-30 DIAGNOSIS — Z87440 Personal history of urinary (tract) infections: Secondary | ICD-10-CM | POA: Diagnosis not present

## 2017-04-30 DIAGNOSIS — Z96641 Presence of right artificial hip joint: Secondary | ICD-10-CM | POA: Diagnosis not present

## 2017-05-01 DIAGNOSIS — T84030D Mechanical loosening of internal right hip prosthetic joint, subsequent encounter: Secondary | ICD-10-CM | POA: Diagnosis not present

## 2017-05-01 DIAGNOSIS — E440 Moderate protein-calorie malnutrition: Secondary | ICD-10-CM | POA: Diagnosis not present

## 2017-05-01 DIAGNOSIS — Z96641 Presence of right artificial hip joint: Secondary | ICD-10-CM | POA: Diagnosis not present

## 2017-05-01 DIAGNOSIS — I10 Essential (primary) hypertension: Secondary | ICD-10-CM | POA: Diagnosis not present

## 2017-05-01 DIAGNOSIS — Z87440 Personal history of urinary (tract) infections: Secondary | ICD-10-CM | POA: Diagnosis not present

## 2017-05-01 DIAGNOSIS — I872 Venous insufficiency (chronic) (peripheral): Secondary | ICD-10-CM | POA: Diagnosis not present

## 2017-05-02 DIAGNOSIS — Z96649 Presence of unspecified artificial hip joint: Secondary | ICD-10-CM | POA: Diagnosis not present

## 2017-05-02 DIAGNOSIS — M978XXA Periprosthetic fracture around other internal prosthetic joint, initial encounter: Secondary | ICD-10-CM | POA: Diagnosis not present

## 2017-05-03 DIAGNOSIS — Z87440 Personal history of urinary (tract) infections: Secondary | ICD-10-CM | POA: Diagnosis not present

## 2017-05-03 DIAGNOSIS — T84030D Mechanical loosening of internal right hip prosthetic joint, subsequent encounter: Secondary | ICD-10-CM | POA: Diagnosis not present

## 2017-05-03 DIAGNOSIS — Z96641 Presence of right artificial hip joint: Secondary | ICD-10-CM | POA: Diagnosis not present

## 2017-05-03 DIAGNOSIS — I872 Venous insufficiency (chronic) (peripheral): Secondary | ICD-10-CM | POA: Diagnosis not present

## 2017-05-03 DIAGNOSIS — E440 Moderate protein-calorie malnutrition: Secondary | ICD-10-CM | POA: Diagnosis not present

## 2017-05-03 DIAGNOSIS — I10 Essential (primary) hypertension: Secondary | ICD-10-CM | POA: Diagnosis not present

## 2017-05-06 DIAGNOSIS — T84030D Mechanical loosening of internal right hip prosthetic joint, subsequent encounter: Secondary | ICD-10-CM | POA: Diagnosis not present

## 2017-05-06 DIAGNOSIS — I872 Venous insufficiency (chronic) (peripheral): Secondary | ICD-10-CM | POA: Diagnosis not present

## 2017-05-06 DIAGNOSIS — Z87440 Personal history of urinary (tract) infections: Secondary | ICD-10-CM | POA: Diagnosis not present

## 2017-05-06 DIAGNOSIS — Z96641 Presence of right artificial hip joint: Secondary | ICD-10-CM | POA: Diagnosis not present

## 2017-05-06 DIAGNOSIS — E440 Moderate protein-calorie malnutrition: Secondary | ICD-10-CM | POA: Diagnosis not present

## 2017-05-06 DIAGNOSIS — I10 Essential (primary) hypertension: Secondary | ICD-10-CM | POA: Diagnosis not present

## 2017-05-07 DIAGNOSIS — E440 Moderate protein-calorie malnutrition: Secondary | ICD-10-CM | POA: Diagnosis not present

## 2017-05-07 DIAGNOSIS — I872 Venous insufficiency (chronic) (peripheral): Secondary | ICD-10-CM | POA: Diagnosis not present

## 2017-05-07 DIAGNOSIS — T84030D Mechanical loosening of internal right hip prosthetic joint, subsequent encounter: Secondary | ICD-10-CM | POA: Diagnosis not present

## 2017-05-07 DIAGNOSIS — Z96641 Presence of right artificial hip joint: Secondary | ICD-10-CM | POA: Diagnosis not present

## 2017-05-07 DIAGNOSIS — I10 Essential (primary) hypertension: Secondary | ICD-10-CM | POA: Diagnosis not present

## 2017-05-07 DIAGNOSIS — Z87440 Personal history of urinary (tract) infections: Secondary | ICD-10-CM | POA: Diagnosis not present

## 2017-05-13 DIAGNOSIS — T84030D Mechanical loosening of internal right hip prosthetic joint, subsequent encounter: Secondary | ICD-10-CM | POA: Diagnosis not present

## 2017-05-13 DIAGNOSIS — Z96641 Presence of right artificial hip joint: Secondary | ICD-10-CM | POA: Diagnosis not present

## 2017-05-13 DIAGNOSIS — I10 Essential (primary) hypertension: Secondary | ICD-10-CM | POA: Diagnosis not present

## 2017-05-13 DIAGNOSIS — I872 Venous insufficiency (chronic) (peripheral): Secondary | ICD-10-CM | POA: Diagnosis not present

## 2017-05-13 DIAGNOSIS — E440 Moderate protein-calorie malnutrition: Secondary | ICD-10-CM | POA: Diagnosis not present

## 2017-05-13 DIAGNOSIS — Z87440 Personal history of urinary (tract) infections: Secondary | ICD-10-CM | POA: Diagnosis not present

## 2017-05-14 DIAGNOSIS — T84030D Mechanical loosening of internal right hip prosthetic joint, subsequent encounter: Secondary | ICD-10-CM | POA: Diagnosis not present

## 2017-05-14 DIAGNOSIS — E440 Moderate protein-calorie malnutrition: Secondary | ICD-10-CM | POA: Diagnosis not present

## 2017-05-14 DIAGNOSIS — I10 Essential (primary) hypertension: Secondary | ICD-10-CM | POA: Diagnosis not present

## 2017-05-14 DIAGNOSIS — Z87440 Personal history of urinary (tract) infections: Secondary | ICD-10-CM | POA: Diagnosis not present

## 2017-05-14 DIAGNOSIS — I872 Venous insufficiency (chronic) (peripheral): Secondary | ICD-10-CM | POA: Diagnosis not present

## 2017-05-14 DIAGNOSIS — Z96641 Presence of right artificial hip joint: Secondary | ICD-10-CM | POA: Diagnosis not present

## 2017-05-16 DIAGNOSIS — Z87440 Personal history of urinary (tract) infections: Secondary | ICD-10-CM | POA: Diagnosis not present

## 2017-05-16 DIAGNOSIS — I872 Venous insufficiency (chronic) (peripheral): Secondary | ICD-10-CM | POA: Diagnosis not present

## 2017-05-16 DIAGNOSIS — T84030D Mechanical loosening of internal right hip prosthetic joint, subsequent encounter: Secondary | ICD-10-CM | POA: Diagnosis not present

## 2017-05-16 DIAGNOSIS — Z96641 Presence of right artificial hip joint: Secondary | ICD-10-CM | POA: Diagnosis not present

## 2017-05-16 DIAGNOSIS — E440 Moderate protein-calorie malnutrition: Secondary | ICD-10-CM | POA: Diagnosis not present

## 2017-05-16 DIAGNOSIS — I10 Essential (primary) hypertension: Secondary | ICD-10-CM | POA: Diagnosis not present

## 2017-05-19 DIAGNOSIS — E440 Moderate protein-calorie malnutrition: Secondary | ICD-10-CM | POA: Diagnosis not present

## 2017-05-19 DIAGNOSIS — Z87440 Personal history of urinary (tract) infections: Secondary | ICD-10-CM | POA: Diagnosis not present

## 2017-05-19 DIAGNOSIS — I872 Venous insufficiency (chronic) (peripheral): Secondary | ICD-10-CM | POA: Diagnosis not present

## 2017-05-19 DIAGNOSIS — Z96641 Presence of right artificial hip joint: Secondary | ICD-10-CM | POA: Diagnosis not present

## 2017-05-19 DIAGNOSIS — I10 Essential (primary) hypertension: Secondary | ICD-10-CM | POA: Diagnosis not present

## 2017-05-19 DIAGNOSIS — T84030D Mechanical loosening of internal right hip prosthetic joint, subsequent encounter: Secondary | ICD-10-CM | POA: Diagnosis not present

## 2017-05-21 DIAGNOSIS — T84030D Mechanical loosening of internal right hip prosthetic joint, subsequent encounter: Secondary | ICD-10-CM | POA: Diagnosis not present

## 2017-05-21 DIAGNOSIS — Z87440 Personal history of urinary (tract) infections: Secondary | ICD-10-CM | POA: Diagnosis not present

## 2017-05-21 DIAGNOSIS — I10 Essential (primary) hypertension: Secondary | ICD-10-CM | POA: Diagnosis not present

## 2017-05-21 DIAGNOSIS — I872 Venous insufficiency (chronic) (peripheral): Secondary | ICD-10-CM | POA: Diagnosis not present

## 2017-05-21 DIAGNOSIS — Z96641 Presence of right artificial hip joint: Secondary | ICD-10-CM | POA: Diagnosis not present

## 2017-05-21 DIAGNOSIS — E440 Moderate protein-calorie malnutrition: Secondary | ICD-10-CM | POA: Diagnosis not present

## 2017-05-22 DIAGNOSIS — I10 Essential (primary) hypertension: Secondary | ICD-10-CM | POA: Diagnosis not present

## 2017-05-22 DIAGNOSIS — I872 Venous insufficiency (chronic) (peripheral): Secondary | ICD-10-CM | POA: Diagnosis not present

## 2017-05-22 DIAGNOSIS — E440 Moderate protein-calorie malnutrition: Secondary | ICD-10-CM | POA: Diagnosis not present

## 2017-05-22 DIAGNOSIS — Z87440 Personal history of urinary (tract) infections: Secondary | ICD-10-CM | POA: Diagnosis not present

## 2017-05-22 DIAGNOSIS — Z96641 Presence of right artificial hip joint: Secondary | ICD-10-CM | POA: Diagnosis not present

## 2017-05-22 DIAGNOSIS — T84030D Mechanical loosening of internal right hip prosthetic joint, subsequent encounter: Secondary | ICD-10-CM | POA: Diagnosis not present

## 2017-05-23 DIAGNOSIS — I10 Essential (primary) hypertension: Secondary | ICD-10-CM | POA: Diagnosis not present

## 2017-05-23 DIAGNOSIS — T84030D Mechanical loosening of internal right hip prosthetic joint, subsequent encounter: Secondary | ICD-10-CM | POA: Diagnosis not present

## 2017-05-23 DIAGNOSIS — Z87440 Personal history of urinary (tract) infections: Secondary | ICD-10-CM | POA: Diagnosis not present

## 2017-05-23 DIAGNOSIS — E440 Moderate protein-calorie malnutrition: Secondary | ICD-10-CM | POA: Diagnosis not present

## 2017-05-23 DIAGNOSIS — I872 Venous insufficiency (chronic) (peripheral): Secondary | ICD-10-CM | POA: Diagnosis not present

## 2017-05-23 DIAGNOSIS — Z96641 Presence of right artificial hip joint: Secondary | ICD-10-CM | POA: Diagnosis not present

## 2017-05-26 DIAGNOSIS — I10 Essential (primary) hypertension: Secondary | ICD-10-CM | POA: Diagnosis not present

## 2017-05-26 DIAGNOSIS — I872 Venous insufficiency (chronic) (peripheral): Secondary | ICD-10-CM | POA: Diagnosis not present

## 2017-05-26 DIAGNOSIS — E440 Moderate protein-calorie malnutrition: Secondary | ICD-10-CM | POA: Diagnosis not present

## 2017-05-26 DIAGNOSIS — T84030D Mechanical loosening of internal right hip prosthetic joint, subsequent encounter: Secondary | ICD-10-CM | POA: Diagnosis not present

## 2017-05-26 DIAGNOSIS — Z87440 Personal history of urinary (tract) infections: Secondary | ICD-10-CM | POA: Diagnosis not present

## 2017-05-26 DIAGNOSIS — Z96641 Presence of right artificial hip joint: Secondary | ICD-10-CM | POA: Diagnosis not present

## 2017-05-27 DIAGNOSIS — Z87440 Personal history of urinary (tract) infections: Secondary | ICD-10-CM | POA: Diagnosis not present

## 2017-05-27 DIAGNOSIS — I872 Venous insufficiency (chronic) (peripheral): Secondary | ICD-10-CM | POA: Diagnosis not present

## 2017-05-27 DIAGNOSIS — Z96641 Presence of right artificial hip joint: Secondary | ICD-10-CM | POA: Diagnosis not present

## 2017-05-27 DIAGNOSIS — I10 Essential (primary) hypertension: Secondary | ICD-10-CM | POA: Diagnosis not present

## 2017-05-27 DIAGNOSIS — T84030D Mechanical loosening of internal right hip prosthetic joint, subsequent encounter: Secondary | ICD-10-CM | POA: Diagnosis not present

## 2017-05-27 DIAGNOSIS — E440 Moderate protein-calorie malnutrition: Secondary | ICD-10-CM | POA: Diagnosis not present

## 2017-05-28 DIAGNOSIS — T84030D Mechanical loosening of internal right hip prosthetic joint, subsequent encounter: Secondary | ICD-10-CM | POA: Diagnosis not present

## 2017-05-28 DIAGNOSIS — I872 Venous insufficiency (chronic) (peripheral): Secondary | ICD-10-CM | POA: Diagnosis not present

## 2017-05-28 DIAGNOSIS — I10 Essential (primary) hypertension: Secondary | ICD-10-CM | POA: Diagnosis not present

## 2017-05-28 DIAGNOSIS — Z96641 Presence of right artificial hip joint: Secondary | ICD-10-CM | POA: Diagnosis not present

## 2017-05-28 DIAGNOSIS — E440 Moderate protein-calorie malnutrition: Secondary | ICD-10-CM | POA: Diagnosis not present

## 2017-05-28 DIAGNOSIS — Z87440 Personal history of urinary (tract) infections: Secondary | ICD-10-CM | POA: Diagnosis not present

## 2017-05-30 DIAGNOSIS — E440 Moderate protein-calorie malnutrition: Secondary | ICD-10-CM | POA: Diagnosis not present

## 2017-05-30 DIAGNOSIS — Z96641 Presence of right artificial hip joint: Secondary | ICD-10-CM | POA: Diagnosis not present

## 2017-05-30 DIAGNOSIS — Z87440 Personal history of urinary (tract) infections: Secondary | ICD-10-CM | POA: Diagnosis not present

## 2017-05-30 DIAGNOSIS — T84030D Mechanical loosening of internal right hip prosthetic joint, subsequent encounter: Secondary | ICD-10-CM | POA: Diagnosis not present

## 2017-05-30 DIAGNOSIS — I872 Venous insufficiency (chronic) (peripheral): Secondary | ICD-10-CM | POA: Diagnosis not present

## 2017-05-30 DIAGNOSIS — I10 Essential (primary) hypertension: Secondary | ICD-10-CM | POA: Diagnosis not present

## 2017-06-03 DIAGNOSIS — I872 Venous insufficiency (chronic) (peripheral): Secondary | ICD-10-CM | POA: Diagnosis not present

## 2017-06-03 DIAGNOSIS — Z96641 Presence of right artificial hip joint: Secondary | ICD-10-CM | POA: Diagnosis not present

## 2017-06-03 DIAGNOSIS — I10 Essential (primary) hypertension: Secondary | ICD-10-CM | POA: Diagnosis not present

## 2017-06-03 DIAGNOSIS — T84030D Mechanical loosening of internal right hip prosthetic joint, subsequent encounter: Secondary | ICD-10-CM | POA: Diagnosis not present

## 2017-06-03 DIAGNOSIS — Z87440 Personal history of urinary (tract) infections: Secondary | ICD-10-CM | POA: Diagnosis not present

## 2017-06-03 DIAGNOSIS — E440 Moderate protein-calorie malnutrition: Secondary | ICD-10-CM | POA: Diagnosis not present

## 2017-06-06 DIAGNOSIS — T84030D Mechanical loosening of internal right hip prosthetic joint, subsequent encounter: Secondary | ICD-10-CM | POA: Diagnosis not present

## 2017-06-06 DIAGNOSIS — Z96641 Presence of right artificial hip joint: Secondary | ICD-10-CM | POA: Diagnosis not present

## 2017-06-06 DIAGNOSIS — E440 Moderate protein-calorie malnutrition: Secondary | ICD-10-CM | POA: Diagnosis not present

## 2017-06-06 DIAGNOSIS — Z87440 Personal history of urinary (tract) infections: Secondary | ICD-10-CM | POA: Diagnosis not present

## 2017-06-06 DIAGNOSIS — I10 Essential (primary) hypertension: Secondary | ICD-10-CM | POA: Diagnosis not present

## 2017-06-06 DIAGNOSIS — I872 Venous insufficiency (chronic) (peripheral): Secondary | ICD-10-CM | POA: Diagnosis not present

## 2017-06-10 DIAGNOSIS — E440 Moderate protein-calorie malnutrition: Secondary | ICD-10-CM | POA: Diagnosis not present

## 2017-06-10 DIAGNOSIS — T84030D Mechanical loosening of internal right hip prosthetic joint, subsequent encounter: Secondary | ICD-10-CM | POA: Diagnosis not present

## 2017-06-10 DIAGNOSIS — Z96641 Presence of right artificial hip joint: Secondary | ICD-10-CM | POA: Diagnosis not present

## 2017-06-10 DIAGNOSIS — I10 Essential (primary) hypertension: Secondary | ICD-10-CM | POA: Diagnosis not present

## 2017-06-10 DIAGNOSIS — I872 Venous insufficiency (chronic) (peripheral): Secondary | ICD-10-CM | POA: Diagnosis not present

## 2017-06-10 DIAGNOSIS — Z87440 Personal history of urinary (tract) infections: Secondary | ICD-10-CM | POA: Diagnosis not present

## 2017-06-12 DIAGNOSIS — E440 Moderate protein-calorie malnutrition: Secondary | ICD-10-CM | POA: Diagnosis not present

## 2017-06-12 DIAGNOSIS — T84030D Mechanical loosening of internal right hip prosthetic joint, subsequent encounter: Secondary | ICD-10-CM | POA: Diagnosis not present

## 2017-06-12 DIAGNOSIS — I872 Venous insufficiency (chronic) (peripheral): Secondary | ICD-10-CM | POA: Diagnosis not present

## 2017-06-12 DIAGNOSIS — Z87440 Personal history of urinary (tract) infections: Secondary | ICD-10-CM | POA: Diagnosis not present

## 2017-06-12 DIAGNOSIS — I10 Essential (primary) hypertension: Secondary | ICD-10-CM | POA: Diagnosis not present

## 2017-06-12 DIAGNOSIS — Z96641 Presence of right artificial hip joint: Secondary | ICD-10-CM | POA: Diagnosis not present

## 2017-06-15 DIAGNOSIS — E440 Moderate protein-calorie malnutrition: Secondary | ICD-10-CM | POA: Diagnosis not present

## 2017-06-15 DIAGNOSIS — T84030D Mechanical loosening of internal right hip prosthetic joint, subsequent encounter: Secondary | ICD-10-CM | POA: Diagnosis not present

## 2017-06-15 DIAGNOSIS — Z96641 Presence of right artificial hip joint: Secondary | ICD-10-CM | POA: Diagnosis not present

## 2017-06-15 DIAGNOSIS — Z87440 Personal history of urinary (tract) infections: Secondary | ICD-10-CM | POA: Diagnosis not present

## 2017-06-15 DIAGNOSIS — I872 Venous insufficiency (chronic) (peripheral): Secondary | ICD-10-CM | POA: Diagnosis not present

## 2017-06-15 DIAGNOSIS — I10 Essential (primary) hypertension: Secondary | ICD-10-CM | POA: Diagnosis not present

## 2017-06-19 DIAGNOSIS — M25551 Pain in right hip: Secondary | ICD-10-CM | POA: Diagnosis not present

## 2017-06-19 DIAGNOSIS — M978XXD Periprosthetic fracture around other internal prosthetic joint, subsequent encounter: Secondary | ICD-10-CM | POA: Diagnosis not present

## 2017-06-20 DIAGNOSIS — T84030D Mechanical loosening of internal right hip prosthetic joint, subsequent encounter: Secondary | ICD-10-CM | POA: Diagnosis not present

## 2017-06-20 DIAGNOSIS — E440 Moderate protein-calorie malnutrition: Secondary | ICD-10-CM | POA: Diagnosis not present

## 2017-06-20 DIAGNOSIS — I872 Venous insufficiency (chronic) (peripheral): Secondary | ICD-10-CM | POA: Diagnosis not present

## 2017-06-20 DIAGNOSIS — Z96641 Presence of right artificial hip joint: Secondary | ICD-10-CM | POA: Diagnosis not present

## 2017-06-20 DIAGNOSIS — Z87440 Personal history of urinary (tract) infections: Secondary | ICD-10-CM | POA: Diagnosis not present

## 2017-06-20 DIAGNOSIS — I10 Essential (primary) hypertension: Secondary | ICD-10-CM | POA: Diagnosis not present

## 2017-06-24 DIAGNOSIS — Z96641 Presence of right artificial hip joint: Secondary | ICD-10-CM | POA: Diagnosis not present

## 2017-06-24 DIAGNOSIS — I872 Venous insufficiency (chronic) (peripheral): Secondary | ICD-10-CM | POA: Diagnosis not present

## 2017-06-24 DIAGNOSIS — Z87440 Personal history of urinary (tract) infections: Secondary | ICD-10-CM | POA: Diagnosis not present

## 2017-06-24 DIAGNOSIS — I10 Essential (primary) hypertension: Secondary | ICD-10-CM | POA: Diagnosis not present

## 2017-06-24 DIAGNOSIS — T84030D Mechanical loosening of internal right hip prosthetic joint, subsequent encounter: Secondary | ICD-10-CM | POA: Diagnosis not present

## 2017-06-24 DIAGNOSIS — E440 Moderate protein-calorie malnutrition: Secondary | ICD-10-CM | POA: Diagnosis not present

## 2017-06-26 DIAGNOSIS — T84030D Mechanical loosening of internal right hip prosthetic joint, subsequent encounter: Secondary | ICD-10-CM | POA: Diagnosis not present

## 2017-06-26 DIAGNOSIS — Z87440 Personal history of urinary (tract) infections: Secondary | ICD-10-CM | POA: Diagnosis not present

## 2017-06-26 DIAGNOSIS — I10 Essential (primary) hypertension: Secondary | ICD-10-CM | POA: Diagnosis not present

## 2017-06-26 DIAGNOSIS — I872 Venous insufficiency (chronic) (peripheral): Secondary | ICD-10-CM | POA: Diagnosis not present

## 2017-06-26 DIAGNOSIS — Z96641 Presence of right artificial hip joint: Secondary | ICD-10-CM | POA: Diagnosis not present

## 2017-06-26 DIAGNOSIS — E440 Moderate protein-calorie malnutrition: Secondary | ICD-10-CM | POA: Diagnosis not present

## 2017-06-30 DIAGNOSIS — I10 Essential (primary) hypertension: Secondary | ICD-10-CM | POA: Diagnosis not present

## 2017-06-30 DIAGNOSIS — I872 Venous insufficiency (chronic) (peripheral): Secondary | ICD-10-CM | POA: Diagnosis not present

## 2017-06-30 DIAGNOSIS — T84030D Mechanical loosening of internal right hip prosthetic joint, subsequent encounter: Secondary | ICD-10-CM | POA: Diagnosis not present

## 2017-06-30 DIAGNOSIS — E440 Moderate protein-calorie malnutrition: Secondary | ICD-10-CM | POA: Diagnosis not present

## 2017-06-30 DIAGNOSIS — Z96641 Presence of right artificial hip joint: Secondary | ICD-10-CM | POA: Diagnosis not present

## 2017-06-30 DIAGNOSIS — Z87440 Personal history of urinary (tract) infections: Secondary | ICD-10-CM | POA: Diagnosis not present

## 2017-07-04 DIAGNOSIS — I872 Venous insufficiency (chronic) (peripheral): Secondary | ICD-10-CM | POA: Diagnosis not present

## 2017-07-04 DIAGNOSIS — I10 Essential (primary) hypertension: Secondary | ICD-10-CM | POA: Diagnosis not present

## 2017-07-04 DIAGNOSIS — T84030D Mechanical loosening of internal right hip prosthetic joint, subsequent encounter: Secondary | ICD-10-CM | POA: Diagnosis not present

## 2017-07-04 DIAGNOSIS — Z96641 Presence of right artificial hip joint: Secondary | ICD-10-CM | POA: Diagnosis not present

## 2017-07-04 DIAGNOSIS — Z87440 Personal history of urinary (tract) infections: Secondary | ICD-10-CM | POA: Diagnosis not present

## 2017-07-04 DIAGNOSIS — E440 Moderate protein-calorie malnutrition: Secondary | ICD-10-CM | POA: Diagnosis not present

## 2017-07-07 DIAGNOSIS — E440 Moderate protein-calorie malnutrition: Secondary | ICD-10-CM | POA: Diagnosis not present

## 2017-07-07 DIAGNOSIS — Z87440 Personal history of urinary (tract) infections: Secondary | ICD-10-CM | POA: Diagnosis not present

## 2017-07-07 DIAGNOSIS — Z96641 Presence of right artificial hip joint: Secondary | ICD-10-CM | POA: Diagnosis not present

## 2017-07-07 DIAGNOSIS — I10 Essential (primary) hypertension: Secondary | ICD-10-CM | POA: Diagnosis not present

## 2017-07-07 DIAGNOSIS — T84030D Mechanical loosening of internal right hip prosthetic joint, subsequent encounter: Secondary | ICD-10-CM | POA: Diagnosis not present

## 2017-07-07 DIAGNOSIS — I872 Venous insufficiency (chronic) (peripheral): Secondary | ICD-10-CM | POA: Diagnosis not present

## 2017-07-10 DIAGNOSIS — Z96641 Presence of right artificial hip joint: Secondary | ICD-10-CM | POA: Diagnosis not present

## 2017-07-10 DIAGNOSIS — E440 Moderate protein-calorie malnutrition: Secondary | ICD-10-CM | POA: Diagnosis not present

## 2017-07-10 DIAGNOSIS — T84030D Mechanical loosening of internal right hip prosthetic joint, subsequent encounter: Secondary | ICD-10-CM | POA: Diagnosis not present

## 2017-07-10 DIAGNOSIS — Z87440 Personal history of urinary (tract) infections: Secondary | ICD-10-CM | POA: Diagnosis not present

## 2017-07-10 DIAGNOSIS — I10 Essential (primary) hypertension: Secondary | ICD-10-CM | POA: Diagnosis not present

## 2017-07-10 DIAGNOSIS — I872 Venous insufficiency (chronic) (peripheral): Secondary | ICD-10-CM | POA: Diagnosis not present

## 2017-07-16 DIAGNOSIS — I10 Essential (primary) hypertension: Secondary | ICD-10-CM | POA: Diagnosis not present

## 2017-07-16 DIAGNOSIS — T84030D Mechanical loosening of internal right hip prosthetic joint, subsequent encounter: Secondary | ICD-10-CM | POA: Diagnosis not present

## 2017-07-16 DIAGNOSIS — Z87440 Personal history of urinary (tract) infections: Secondary | ICD-10-CM | POA: Diagnosis not present

## 2017-07-16 DIAGNOSIS — I872 Venous insufficiency (chronic) (peripheral): Secondary | ICD-10-CM | POA: Diagnosis not present

## 2017-07-16 DIAGNOSIS — Z96641 Presence of right artificial hip joint: Secondary | ICD-10-CM | POA: Diagnosis not present

## 2017-07-16 DIAGNOSIS — E440 Moderate protein-calorie malnutrition: Secondary | ICD-10-CM | POA: Diagnosis not present

## 2017-07-17 DIAGNOSIS — T84030D Mechanical loosening of internal right hip prosthetic joint, subsequent encounter: Secondary | ICD-10-CM | POA: Diagnosis not present

## 2017-07-17 DIAGNOSIS — Z87440 Personal history of urinary (tract) infections: Secondary | ICD-10-CM | POA: Diagnosis not present

## 2017-07-17 DIAGNOSIS — I10 Essential (primary) hypertension: Secondary | ICD-10-CM | POA: Diagnosis not present

## 2017-07-17 DIAGNOSIS — Z96641 Presence of right artificial hip joint: Secondary | ICD-10-CM | POA: Diagnosis not present

## 2017-07-17 DIAGNOSIS — I872 Venous insufficiency (chronic) (peripheral): Secondary | ICD-10-CM | POA: Diagnosis not present

## 2017-07-17 DIAGNOSIS — E440 Moderate protein-calorie malnutrition: Secondary | ICD-10-CM | POA: Diagnosis not present

## 2017-07-21 DIAGNOSIS — Z87440 Personal history of urinary (tract) infections: Secondary | ICD-10-CM | POA: Diagnosis not present

## 2017-07-21 DIAGNOSIS — I10 Essential (primary) hypertension: Secondary | ICD-10-CM | POA: Diagnosis not present

## 2017-07-21 DIAGNOSIS — T84030D Mechanical loosening of internal right hip prosthetic joint, subsequent encounter: Secondary | ICD-10-CM | POA: Diagnosis not present

## 2017-07-21 DIAGNOSIS — Z96641 Presence of right artificial hip joint: Secondary | ICD-10-CM | POA: Diagnosis not present

## 2017-07-21 DIAGNOSIS — I872 Venous insufficiency (chronic) (peripheral): Secondary | ICD-10-CM | POA: Diagnosis not present

## 2017-07-21 DIAGNOSIS — E440 Moderate protein-calorie malnutrition: Secondary | ICD-10-CM | POA: Diagnosis not present

## 2017-07-24 DIAGNOSIS — Z87440 Personal history of urinary (tract) infections: Secondary | ICD-10-CM | POA: Diagnosis not present

## 2017-07-24 DIAGNOSIS — I872 Venous insufficiency (chronic) (peripheral): Secondary | ICD-10-CM | POA: Diagnosis not present

## 2017-07-24 DIAGNOSIS — T84030D Mechanical loosening of internal right hip prosthetic joint, subsequent encounter: Secondary | ICD-10-CM | POA: Diagnosis not present

## 2017-07-24 DIAGNOSIS — Z96641 Presence of right artificial hip joint: Secondary | ICD-10-CM | POA: Diagnosis not present

## 2017-07-24 DIAGNOSIS — I10 Essential (primary) hypertension: Secondary | ICD-10-CM | POA: Diagnosis not present

## 2017-07-24 DIAGNOSIS — E440 Moderate protein-calorie malnutrition: Secondary | ICD-10-CM | POA: Diagnosis not present

## 2017-07-28 DIAGNOSIS — I872 Venous insufficiency (chronic) (peripheral): Secondary | ICD-10-CM | POA: Diagnosis not present

## 2017-07-28 DIAGNOSIS — Z87440 Personal history of urinary (tract) infections: Secondary | ICD-10-CM | POA: Diagnosis not present

## 2017-07-28 DIAGNOSIS — Z96641 Presence of right artificial hip joint: Secondary | ICD-10-CM | POA: Diagnosis not present

## 2017-07-28 DIAGNOSIS — I10 Essential (primary) hypertension: Secondary | ICD-10-CM | POA: Diagnosis not present

## 2017-07-28 DIAGNOSIS — E440 Moderate protein-calorie malnutrition: Secondary | ICD-10-CM | POA: Diagnosis not present

## 2017-07-28 DIAGNOSIS — T84030D Mechanical loosening of internal right hip prosthetic joint, subsequent encounter: Secondary | ICD-10-CM | POA: Diagnosis not present

## 2017-07-31 DIAGNOSIS — E440 Moderate protein-calorie malnutrition: Secondary | ICD-10-CM | POA: Diagnosis not present

## 2017-07-31 DIAGNOSIS — I10 Essential (primary) hypertension: Secondary | ICD-10-CM | POA: Diagnosis not present

## 2017-07-31 DIAGNOSIS — T84030D Mechanical loosening of internal right hip prosthetic joint, subsequent encounter: Secondary | ICD-10-CM | POA: Diagnosis not present

## 2017-07-31 DIAGNOSIS — Z87440 Personal history of urinary (tract) infections: Secondary | ICD-10-CM | POA: Diagnosis not present

## 2017-07-31 DIAGNOSIS — Z96641 Presence of right artificial hip joint: Secondary | ICD-10-CM | POA: Diagnosis not present

## 2017-07-31 DIAGNOSIS — I872 Venous insufficiency (chronic) (peripheral): Secondary | ICD-10-CM | POA: Diagnosis not present

## 2017-08-04 DIAGNOSIS — E440 Moderate protein-calorie malnutrition: Secondary | ICD-10-CM | POA: Diagnosis not present

## 2017-08-04 DIAGNOSIS — I10 Essential (primary) hypertension: Secondary | ICD-10-CM | POA: Diagnosis not present

## 2017-08-04 DIAGNOSIS — Z96641 Presence of right artificial hip joint: Secondary | ICD-10-CM | POA: Diagnosis not present

## 2017-08-04 DIAGNOSIS — Z87440 Personal history of urinary (tract) infections: Secondary | ICD-10-CM | POA: Diagnosis not present

## 2017-08-04 DIAGNOSIS — T84030D Mechanical loosening of internal right hip prosthetic joint, subsequent encounter: Secondary | ICD-10-CM | POA: Diagnosis not present

## 2017-08-04 DIAGNOSIS — I872 Venous insufficiency (chronic) (peripheral): Secondary | ICD-10-CM | POA: Diagnosis not present

## 2017-08-07 DIAGNOSIS — I872 Venous insufficiency (chronic) (peripheral): Secondary | ICD-10-CM | POA: Diagnosis not present

## 2017-08-07 DIAGNOSIS — I10 Essential (primary) hypertension: Secondary | ICD-10-CM | POA: Diagnosis not present

## 2017-08-07 DIAGNOSIS — E440 Moderate protein-calorie malnutrition: Secondary | ICD-10-CM | POA: Diagnosis not present

## 2017-08-07 DIAGNOSIS — Z87440 Personal history of urinary (tract) infections: Secondary | ICD-10-CM | POA: Diagnosis not present

## 2017-08-07 DIAGNOSIS — T84030D Mechanical loosening of internal right hip prosthetic joint, subsequent encounter: Secondary | ICD-10-CM | POA: Diagnosis not present

## 2017-08-07 DIAGNOSIS — Z96641 Presence of right artificial hip joint: Secondary | ICD-10-CM | POA: Diagnosis not present

## 2017-08-11 DIAGNOSIS — Z96641 Presence of right artificial hip joint: Secondary | ICD-10-CM | POA: Diagnosis not present

## 2017-08-11 DIAGNOSIS — I10 Essential (primary) hypertension: Secondary | ICD-10-CM | POA: Diagnosis not present

## 2017-08-11 DIAGNOSIS — T84030D Mechanical loosening of internal right hip prosthetic joint, subsequent encounter: Secondary | ICD-10-CM | POA: Diagnosis not present

## 2017-08-11 DIAGNOSIS — E440 Moderate protein-calorie malnutrition: Secondary | ICD-10-CM | POA: Diagnosis not present

## 2017-08-11 DIAGNOSIS — Z87440 Personal history of urinary (tract) infections: Secondary | ICD-10-CM | POA: Diagnosis not present

## 2017-08-11 DIAGNOSIS — I872 Venous insufficiency (chronic) (peripheral): Secondary | ICD-10-CM | POA: Diagnosis not present

## 2017-08-13 DIAGNOSIS — E440 Moderate protein-calorie malnutrition: Secondary | ICD-10-CM | POA: Diagnosis not present

## 2017-08-13 DIAGNOSIS — I10 Essential (primary) hypertension: Secondary | ICD-10-CM | POA: Diagnosis not present

## 2017-08-13 DIAGNOSIS — I872 Venous insufficiency (chronic) (peripheral): Secondary | ICD-10-CM | POA: Diagnosis not present

## 2017-08-13 DIAGNOSIS — Z96641 Presence of right artificial hip joint: Secondary | ICD-10-CM | POA: Diagnosis not present

## 2017-08-13 DIAGNOSIS — Z87440 Personal history of urinary (tract) infections: Secondary | ICD-10-CM | POA: Diagnosis not present

## 2017-08-13 DIAGNOSIS — T84030D Mechanical loosening of internal right hip prosthetic joint, subsequent encounter: Secondary | ICD-10-CM | POA: Diagnosis not present

## 2017-08-14 DIAGNOSIS — Z87440 Personal history of urinary (tract) infections: Secondary | ICD-10-CM | POA: Diagnosis not present

## 2017-08-14 DIAGNOSIS — E440 Moderate protein-calorie malnutrition: Secondary | ICD-10-CM | POA: Diagnosis not present

## 2017-08-14 DIAGNOSIS — Z96641 Presence of right artificial hip joint: Secondary | ICD-10-CM | POA: Diagnosis not present

## 2017-08-14 DIAGNOSIS — I872 Venous insufficiency (chronic) (peripheral): Secondary | ICD-10-CM | POA: Diagnosis not present

## 2017-08-14 DIAGNOSIS — I10 Essential (primary) hypertension: Secondary | ICD-10-CM | POA: Diagnosis not present

## 2017-08-14 DIAGNOSIS — T84030D Mechanical loosening of internal right hip prosthetic joint, subsequent encounter: Secondary | ICD-10-CM | POA: Diagnosis not present

## 2017-08-18 DIAGNOSIS — Z87440 Personal history of urinary (tract) infections: Secondary | ICD-10-CM | POA: Diagnosis not present

## 2017-08-18 DIAGNOSIS — E440 Moderate protein-calorie malnutrition: Secondary | ICD-10-CM | POA: Diagnosis not present

## 2017-08-18 DIAGNOSIS — I872 Venous insufficiency (chronic) (peripheral): Secondary | ICD-10-CM | POA: Diagnosis not present

## 2017-08-18 DIAGNOSIS — T84030D Mechanical loosening of internal right hip prosthetic joint, subsequent encounter: Secondary | ICD-10-CM | POA: Diagnosis not present

## 2017-08-18 DIAGNOSIS — I10 Essential (primary) hypertension: Secondary | ICD-10-CM | POA: Diagnosis not present

## 2017-08-18 DIAGNOSIS — Z96641 Presence of right artificial hip joint: Secondary | ICD-10-CM | POA: Diagnosis not present

## 2017-08-21 DIAGNOSIS — Z87440 Personal history of urinary (tract) infections: Secondary | ICD-10-CM | POA: Diagnosis not present

## 2017-08-21 DIAGNOSIS — T84030D Mechanical loosening of internal right hip prosthetic joint, subsequent encounter: Secondary | ICD-10-CM | POA: Diagnosis not present

## 2017-08-21 DIAGNOSIS — I10 Essential (primary) hypertension: Secondary | ICD-10-CM | POA: Diagnosis not present

## 2017-08-21 DIAGNOSIS — I872 Venous insufficiency (chronic) (peripheral): Secondary | ICD-10-CM | POA: Diagnosis not present

## 2017-08-21 DIAGNOSIS — Z96641 Presence of right artificial hip joint: Secondary | ICD-10-CM | POA: Diagnosis not present

## 2017-08-21 DIAGNOSIS — E440 Moderate protein-calorie malnutrition: Secondary | ICD-10-CM | POA: Diagnosis not present

## 2017-08-25 DIAGNOSIS — Z87440 Personal history of urinary (tract) infections: Secondary | ICD-10-CM | POA: Diagnosis not present

## 2017-08-25 DIAGNOSIS — E440 Moderate protein-calorie malnutrition: Secondary | ICD-10-CM | POA: Diagnosis not present

## 2017-08-25 DIAGNOSIS — I872 Venous insufficiency (chronic) (peripheral): Secondary | ICD-10-CM | POA: Diagnosis not present

## 2017-08-25 DIAGNOSIS — I10 Essential (primary) hypertension: Secondary | ICD-10-CM | POA: Diagnosis not present

## 2017-08-25 DIAGNOSIS — T84030D Mechanical loosening of internal right hip prosthetic joint, subsequent encounter: Secondary | ICD-10-CM | POA: Diagnosis not present

## 2017-08-25 DIAGNOSIS — Z96641 Presence of right artificial hip joint: Secondary | ICD-10-CM | POA: Diagnosis not present

## 2017-08-28 DIAGNOSIS — T84030D Mechanical loosening of internal right hip prosthetic joint, subsequent encounter: Secondary | ICD-10-CM | POA: Diagnosis not present

## 2017-08-28 DIAGNOSIS — Z96641 Presence of right artificial hip joint: Secondary | ICD-10-CM | POA: Diagnosis not present

## 2017-08-28 DIAGNOSIS — I872 Venous insufficiency (chronic) (peripheral): Secondary | ICD-10-CM | POA: Diagnosis not present

## 2017-08-28 DIAGNOSIS — E440 Moderate protein-calorie malnutrition: Secondary | ICD-10-CM | POA: Diagnosis not present

## 2017-08-28 DIAGNOSIS — I10 Essential (primary) hypertension: Secondary | ICD-10-CM | POA: Diagnosis not present

## 2017-08-28 DIAGNOSIS — Z87440 Personal history of urinary (tract) infections: Secondary | ICD-10-CM | POA: Diagnosis not present

## 2017-09-01 DIAGNOSIS — T84030D Mechanical loosening of internal right hip prosthetic joint, subsequent encounter: Secondary | ICD-10-CM | POA: Diagnosis not present

## 2017-09-01 DIAGNOSIS — E440 Moderate protein-calorie malnutrition: Secondary | ICD-10-CM | POA: Diagnosis not present

## 2017-09-01 DIAGNOSIS — Z96641 Presence of right artificial hip joint: Secondary | ICD-10-CM | POA: Diagnosis not present

## 2017-09-01 DIAGNOSIS — I10 Essential (primary) hypertension: Secondary | ICD-10-CM | POA: Diagnosis not present

## 2017-09-01 DIAGNOSIS — I872 Venous insufficiency (chronic) (peripheral): Secondary | ICD-10-CM | POA: Diagnosis not present

## 2017-09-01 DIAGNOSIS — Z87440 Personal history of urinary (tract) infections: Secondary | ICD-10-CM | POA: Diagnosis not present

## 2017-09-03 DIAGNOSIS — Z96641 Presence of right artificial hip joint: Secondary | ICD-10-CM | POA: Diagnosis not present

## 2017-09-03 DIAGNOSIS — T84030D Mechanical loosening of internal right hip prosthetic joint, subsequent encounter: Secondary | ICD-10-CM | POA: Diagnosis not present

## 2017-09-03 DIAGNOSIS — I872 Venous insufficiency (chronic) (peripheral): Secondary | ICD-10-CM | POA: Diagnosis not present

## 2017-09-03 DIAGNOSIS — E440 Moderate protein-calorie malnutrition: Secondary | ICD-10-CM | POA: Diagnosis not present

## 2017-09-03 DIAGNOSIS — I10 Essential (primary) hypertension: Secondary | ICD-10-CM | POA: Diagnosis not present

## 2017-09-03 DIAGNOSIS — Z87440 Personal history of urinary (tract) infections: Secondary | ICD-10-CM | POA: Diagnosis not present

## 2017-09-08 DIAGNOSIS — I872 Venous insufficiency (chronic) (peripheral): Secondary | ICD-10-CM | POA: Diagnosis not present

## 2017-09-08 DIAGNOSIS — Z87440 Personal history of urinary (tract) infections: Secondary | ICD-10-CM | POA: Diagnosis not present

## 2017-09-08 DIAGNOSIS — Z96641 Presence of right artificial hip joint: Secondary | ICD-10-CM | POA: Diagnosis not present

## 2017-09-08 DIAGNOSIS — T84030D Mechanical loosening of internal right hip prosthetic joint, subsequent encounter: Secondary | ICD-10-CM | POA: Diagnosis not present

## 2017-09-08 DIAGNOSIS — E440 Moderate protein-calorie malnutrition: Secondary | ICD-10-CM | POA: Diagnosis not present

## 2017-09-08 DIAGNOSIS — I10 Essential (primary) hypertension: Secondary | ICD-10-CM | POA: Diagnosis not present

## 2017-09-12 DIAGNOSIS — Z96641 Presence of right artificial hip joint: Secondary | ICD-10-CM | POA: Diagnosis not present

## 2017-09-12 DIAGNOSIS — I872 Venous insufficiency (chronic) (peripheral): Secondary | ICD-10-CM | POA: Diagnosis not present

## 2017-09-12 DIAGNOSIS — Z87440 Personal history of urinary (tract) infections: Secondary | ICD-10-CM | POA: Diagnosis not present

## 2017-09-12 DIAGNOSIS — T84030D Mechanical loosening of internal right hip prosthetic joint, subsequent encounter: Secondary | ICD-10-CM | POA: Diagnosis not present

## 2017-09-12 DIAGNOSIS — E440 Moderate protein-calorie malnutrition: Secondary | ICD-10-CM | POA: Diagnosis not present

## 2017-09-12 DIAGNOSIS — I10 Essential (primary) hypertension: Secondary | ICD-10-CM | POA: Diagnosis not present

## 2017-09-15 DIAGNOSIS — I10 Essential (primary) hypertension: Secondary | ICD-10-CM | POA: Diagnosis not present

## 2017-09-15 DIAGNOSIS — T84030D Mechanical loosening of internal right hip prosthetic joint, subsequent encounter: Secondary | ICD-10-CM | POA: Diagnosis not present

## 2017-09-15 DIAGNOSIS — Z96641 Presence of right artificial hip joint: Secondary | ICD-10-CM | POA: Diagnosis not present

## 2017-09-15 DIAGNOSIS — Z87440 Personal history of urinary (tract) infections: Secondary | ICD-10-CM | POA: Diagnosis not present

## 2017-09-15 DIAGNOSIS — I872 Venous insufficiency (chronic) (peripheral): Secondary | ICD-10-CM | POA: Diagnosis not present

## 2017-09-15 DIAGNOSIS — E440 Moderate protein-calorie malnutrition: Secondary | ICD-10-CM | POA: Diagnosis not present

## 2017-09-17 DIAGNOSIS — T84030D Mechanical loosening of internal right hip prosthetic joint, subsequent encounter: Secondary | ICD-10-CM | POA: Diagnosis not present

## 2017-09-17 DIAGNOSIS — Z96641 Presence of right artificial hip joint: Secondary | ICD-10-CM | POA: Diagnosis not present

## 2017-09-17 DIAGNOSIS — I10 Essential (primary) hypertension: Secondary | ICD-10-CM | POA: Diagnosis not present

## 2017-09-17 DIAGNOSIS — I872 Venous insufficiency (chronic) (peripheral): Secondary | ICD-10-CM | POA: Diagnosis not present

## 2017-09-17 DIAGNOSIS — E440 Moderate protein-calorie malnutrition: Secondary | ICD-10-CM | POA: Diagnosis not present

## 2017-09-17 DIAGNOSIS — Z87440 Personal history of urinary (tract) infections: Secondary | ICD-10-CM | POA: Diagnosis not present

## 2017-10-20 ENCOUNTER — Other Ambulatory Visit: Payer: Self-pay

## 2017-10-20 ENCOUNTER — Encounter (HOSPITAL_COMMUNITY): Payer: Self-pay

## 2017-10-20 ENCOUNTER — Inpatient Hospital Stay (HOSPITAL_COMMUNITY)
Admission: EM | Admit: 2017-10-20 | Discharge: 2017-10-23 | DRG: 563 | Disposition: A | Discharge status: Skilled Nursing Facility | Payer: Medicare Other | Attending: Internal Medicine | Admitting: Internal Medicine

## 2017-10-20 ENCOUNTER — Emergency Department (HOSPITAL_COMMUNITY): Payer: Medicare Other

## 2017-10-20 DIAGNOSIS — M79603 Pain in arm, unspecified: Secondary | ICD-10-CM | POA: Diagnosis not present

## 2017-10-20 DIAGNOSIS — S42352A Displaced comminuted fracture of shaft of humerus, left arm, initial encounter for closed fracture: Secondary | ICD-10-CM | POA: Diagnosis not present

## 2017-10-20 DIAGNOSIS — W1830XA Fall on same level, unspecified, initial encounter: Secondary | ICD-10-CM | POA: Diagnosis present

## 2017-10-20 DIAGNOSIS — R2681 Unsteadiness on feet: Secondary | ICD-10-CM | POA: Diagnosis not present

## 2017-10-20 DIAGNOSIS — M6281 Muscle weakness (generalized): Secondary | ICD-10-CM | POA: Diagnosis not present

## 2017-10-20 DIAGNOSIS — Z23 Encounter for immunization: Secondary | ICD-10-CM | POA: Diagnosis not present

## 2017-10-20 DIAGNOSIS — Z79899 Other long term (current) drug therapy: Secondary | ICD-10-CM | POA: Diagnosis not present

## 2017-10-20 DIAGNOSIS — S4992XA Unspecified injury of left shoulder and upper arm, initial encounter: Secondary | ICD-10-CM | POA: Diagnosis not present

## 2017-10-20 DIAGNOSIS — T148XXA Other injury of unspecified body region, initial encounter: Secondary | ICD-10-CM | POA: Diagnosis not present

## 2017-10-20 DIAGNOSIS — Z87891 Personal history of nicotine dependence: Secondary | ICD-10-CM

## 2017-10-20 DIAGNOSIS — S42302A Unspecified fracture of shaft of humerus, left arm, initial encounter for closed fracture: Secondary | ICD-10-CM | POA: Diagnosis not present

## 2017-10-20 DIAGNOSIS — S42309A Unspecified fracture of shaft of humerus, unspecified arm, initial encounter for closed fracture: Secondary | ICD-10-CM | POA: Diagnosis present

## 2017-10-20 DIAGNOSIS — S42201A Unspecified fracture of upper end of right humerus, initial encounter for closed fracture: Secondary | ICD-10-CM | POA: Diagnosis not present

## 2017-10-20 DIAGNOSIS — Z880 Allergy status to penicillin: Secondary | ICD-10-CM | POA: Diagnosis not present

## 2017-10-20 DIAGNOSIS — Z9181 History of falling: Secondary | ICD-10-CM | POA: Diagnosis not present

## 2017-10-20 DIAGNOSIS — K5900 Constipation, unspecified: Secondary | ICD-10-CM | POA: Diagnosis not present

## 2017-10-20 DIAGNOSIS — M79622 Pain in left upper arm: Secondary | ICD-10-CM | POA: Diagnosis not present

## 2017-10-20 DIAGNOSIS — I10 Essential (primary) hypertension: Secondary | ICD-10-CM | POA: Diagnosis present

## 2017-10-20 DIAGNOSIS — M1990 Unspecified osteoarthritis, unspecified site: Secondary | ICD-10-CM | POA: Diagnosis present

## 2017-10-20 DIAGNOSIS — S42352D Displaced comminuted fracture of shaft of humerus, left arm, subsequent encounter for fracture with routine healing: Secondary | ICD-10-CM | POA: Diagnosis not present

## 2017-10-20 DIAGNOSIS — R112 Nausea with vomiting, unspecified: Secondary | ICD-10-CM | POA: Diagnosis not present

## 2017-10-20 DIAGNOSIS — R269 Unspecified abnormalities of gait and mobility: Secondary | ICD-10-CM | POA: Diagnosis present

## 2017-10-20 DIAGNOSIS — R278 Other lack of coordination: Secondary | ICD-10-CM | POA: Diagnosis not present

## 2017-10-20 DIAGNOSIS — G8911 Acute pain due to trauma: Secondary | ICD-10-CM | POA: Diagnosis not present

## 2017-10-20 DIAGNOSIS — R4182 Altered mental status, unspecified: Secondary | ICD-10-CM | POA: Diagnosis not present

## 2017-10-20 DIAGNOSIS — R609 Edema, unspecified: Secondary | ICD-10-CM | POA: Diagnosis not present

## 2017-10-20 LAB — BASIC METABOLIC PANEL
ANION GAP: 10 (ref 5–15)
BUN: 15 mg/dL (ref 6–20)
CO2: 25 mmol/L (ref 22–32)
Calcium: 8.8 mg/dL — ABNORMAL LOW (ref 8.9–10.3)
Chloride: 100 mmol/L — ABNORMAL LOW (ref 101–111)
Creatinine, Ser: 0.6 mg/dL — ABNORMAL LOW (ref 0.61–1.24)
GFR calc Af Amer: >60 mL/min (ref >60)
GFR calc non Af Amer: >60 mL/min (ref >60)
GLUCOSE: 112 mg/dL — AB (ref 65–99)
POTASSIUM: 3.8 mmol/L (ref 3.5–5.1)
SODIUM: 135 mmol/L (ref 135–145)

## 2017-10-20 LAB — CBC WITH DIFFERENTIAL/PLATELET
Basophils Absolute: 0 10*3/uL (ref 0.0–0.1)
Basophils Relative: 0 %
Eosinophils Absolute: 0 10*3/uL (ref 0.0–0.7)
Eosinophils Relative: 0 %
HEMATOCRIT: 37.6 % — AB (ref 39.0–52.0)
HEMOGLOBIN: 12.6 g/dL — AB (ref 13.0–17.0)
LYMPHS PCT: 12 %
Lymphs Abs: 1 10*3/uL (ref 0.7–4.0)
MCH: 30.7 pg (ref 26.0–34.0)
MCHC: 33.5 g/dL (ref 30.0–36.0)
MCV: 91.5 fL (ref 78.0–100.0)
Monocytes Absolute: 1.4 10*3/uL — ABNORMAL HIGH (ref 0.1–1.0)
Monocytes Relative: 16 %
NEUTROS ABS: 6.2 10*3/uL (ref 1.7–7.7)
NEUTROS PCT: 72 %
Platelets: 136 10*3/uL — ABNORMAL LOW (ref 150–400)
RBC: 4.11 MIL/uL — AB (ref 4.22–5.81)
RDW: 14.8 % (ref 11.5–15.5)
WBC: 8.6 10*3/uL (ref 4.0–10.5)

## 2017-10-20 LAB — CK: Total CK: 188 U/L (ref 49–397)

## 2017-10-20 MED ORDER — ONDANSETRON HCL 4 MG PO TABS: 4.0000 mg | ORAL_TABLET | Freq: Four times a day (QID) | ORAL | Status: DC | PRN

## 2017-10-20 MED ORDER — ONDANSETRON HCL 4 MG/2ML IJ SOLN
4.0000 mg | Freq: Once | INTRAMUSCULAR | Status: AC
  Administered 2017-10-20: 4 mg via INTRAVENOUS
  Filled 2017-10-20: qty 2

## 2017-10-20 MED ORDER — ACETAMINOPHEN 325 MG PO TABS: 650.0000 mg | ORAL_TABLET | Freq: Four times a day (QID) | ORAL | Status: DC | PRN

## 2017-10-20 MED ORDER — ENOXAPARIN SODIUM 40 MG/0.4ML ~~LOC~~ SOLN
40.0000 mg | Freq: Every day | SUBCUTANEOUS | Status: DC
  Administered 2017-10-21 – 2017-10-22 (×3): 40 mg via SUBCUTANEOUS
  Filled 2017-10-20 (×3): qty 0.4

## 2017-10-20 MED ORDER — MORPHINE SULFATE (PF) 2 MG/ML IV SOLN
2.0000 mg | INTRAVENOUS | Status: DC | PRN
  Administered 2017-10-21: 2 mg via INTRAVENOUS
  Filled 2017-10-20: qty 1

## 2017-10-20 MED ORDER — ZOLPIDEM TARTRATE 5 MG PO TABS: 5.0000 mg | ORAL_TABLET | Freq: Every evening | ORAL | Status: DC | PRN

## 2017-10-20 MED ORDER — SODIUM CHLORIDE 0.9 % IV SOLN: 250.0000 mL | INTRAVENOUS | Status: DC | PRN

## 2017-10-20 MED ORDER — FENTANYL CITRATE (PF) 100 MCG/2ML IJ SOLN
50.0000 ug | Freq: Once | INTRAMUSCULAR | Status: AC
  Administered 2017-10-20: 50 ug via INTRAVENOUS
  Filled 2017-10-20: qty 2

## 2017-10-20 MED ORDER — SALINE SPRAY 0.65 % NA SOLN
1.0000 | NASAL | Status: DC | PRN
  Filled 2017-10-20: qty 44

## 2017-10-20 MED ORDER — SODIUM CHLORIDE 0.9% FLUSH: 3.0000 mL | INTRAVENOUS | Status: DC | PRN

## 2017-10-20 MED ORDER — ONDANSETRON HCL 4 MG/2ML IJ SOLN: 4.0000 mg | Freq: Four times a day (QID) | INTRAMUSCULAR | Status: DC | PRN

## 2017-10-20 MED ORDER — VITAMIN D3 25 MCG (1000 UNIT) PO TABS
2000.0000 [IU] | ORAL_TABLET | Freq: Every day | ORAL | Status: DC
  Administered 2017-10-21 – 2017-10-23 (×3): 2000 [IU] via ORAL
  Filled 2017-10-20 (×4): qty 2

## 2017-10-20 MED ORDER — BENAZEPRIL HCL 10 MG PO TABS
10.0000 mg | ORAL_TABLET | Freq: Every day | ORAL | Status: DC
  Administered 2017-10-21: 10 mg via ORAL
  Filled 2017-10-20: qty 1

## 2017-10-20 MED ORDER — ACETAMINOPHEN 650 MG RE SUPP: 650.0000 mg | Freq: Four times a day (QID) | RECTAL | Status: DC | PRN

## 2017-10-20 MED ORDER — SODIUM CHLORIDE 0.9% FLUSH
3.0000 mL | Freq: Two times a day (BID) | INTRAVENOUS | Status: DC
  Administered 2017-10-21 – 2017-10-23 (×6): 3 mL via INTRAVENOUS

## 2017-10-20 MED ORDER — POLYVINYL ALCOHOL 1.4 % OP SOLN
1.0000 [drp] | Freq: Two times a day (BID) | OPHTHALMIC | Status: DC
  Administered 2017-10-21 – 2017-10-23 (×6): 1 [drp] via OPHTHALMIC
  Filled 2017-10-20: qty 15

## 2017-10-20 MED ORDER — FUROSEMIDE 20 MG PO TABS
20.0000 mg | ORAL_TABLET | Freq: Every day | ORAL | Status: DC
  Administered 2017-10-21: 10:00:00 20 mg via ORAL
  Filled 2017-10-20: qty 1

## 2017-10-20 MED ORDER — HYDROCODONE-ACETAMINOPHEN 5-325 MG PO TABS
1.0000 | ORAL_TABLET | ORAL | Status: DC | PRN
  Administered 2017-10-21: 23:00:00 1 via ORAL
  Administered 2017-10-21 (×2): 2 via ORAL
  Administered 2017-10-21 – 2017-10-22 (×4): 1 via ORAL
  Administered 2017-10-23: 2 via ORAL
  Filled 2017-10-20 (×3): qty 1
  Filled 2017-10-20 (×2): qty 2
  Filled 2017-10-20: qty 1
  Filled 2017-10-20: qty 2
  Filled 2017-10-20 (×2): qty 1

## 2017-10-20 MED ORDER — METOPROLOL TARTRATE 25 MG PO TABS
25.0000 mg | ORAL_TABLET | Freq: Two times a day (BID) | ORAL | Status: DC
  Administered 2017-10-21 – 2017-10-23 (×5): 25 mg via ORAL
  Filled 2017-10-20 (×6): qty 1

## 2017-10-20 NOTE — ED Provider Notes (Signed)
[facility] DEPT Provider Note   CSN: [id number] Arrival date & time: 10/20/17  1724     History   Chief Complaint Chief Complaint  Patient presents with  . Fall  . Arm Pain    HPI David Key is a 82 y.o. male who presents the emergency department for left arm pain.  2 days ago the patient had a mechanical fall.  He states that he was reaching down for his phone charger and when he stepped back he stepped onto the tennis ball attached to his walker.  He fell backward onto his left arm and hip.  He states that he was unable to push himself up and laid on the floor for 13 hours.  Eventually he was able to crawl to his phone and had EMS come out and evaluate him however he did not wish to be transported and EMS found no emergent issues at this time.  Patient states that today his pain was severe in his left upper arm.  He had difficulty using his walker due to the pain and eventually called EMS to come be evaluated.  He denies change in the color of his urine, numbness or tingling in the lower extremities.  Patient states that he is not compliant with the majority of his medications but does take his Lasix and other hypertension medications.  HPI  Past Medical History:  Diagnosis Date  . Arthritis   . Hypertension     Patient Active Problem List   Diagnosis Date Noted  . Urinary tract infection 02/24/2017  . Closed fracture of left humerus 12/05/2013  . Pre-syncope 12/05/2013  . Hyponatremia 12/05/2013  . Alcohol abuse 12/05/2013  . Bilateral leg edema 12/05/2013  . Chronic venous stasis dermatitis 12/05/2013  . HTN (hypertension) 12/05/2013  . UTI (urinary tract infection) 12/05/2013    Past Surgical History:  Procedure Laterality Date  . HIP SURGERY          Home Medications    Prior to Admission medications   Medication Sig Start Date End Date Taking? Authorizing Provider  benazepril (LOTENSIN) 10 MG tablet Take 10 mg by mouth daily.     [provider]  cholecalciferol (VITAMIN D) 1000 units tablet Take 2,000 Units by mouth daily.    [provider]  feeding supplement, ENSURE ENLIVE, (ENSURE ENLIVE) LIQD Take 237 mLs by mouth 2 (two) times daily between meals. 03/01/17   Lavina Hamman, MD  furosemide (LASIX) 20 MG tablet Take 20 mg by mouth daily. 01/29/17   [provider]  metoprolol tartrate (LOPRESSOR) 25 MG tablet Take 1 tablet (25 mg total) by mouth 2 (two) times daily. 03/01/17   Lavina Hamman, MD  Multiple Vitamins-Minerals (ICAPS PO) Take 1 capsule by mouth daily.    [provider]  Propylene Glycol (SYSTANE BALANCE) 0.6 % SOLN Place 1 drop into both eyes 2 (two) times daily.    [provider]  sodium chloride (OCEAN) 0.65 % SOLN nasal spray Place 1 spray into both nostrils as needed for congestion.    [provider]    Family History History reviewed. No pertinent family history.  Social History Social History   Tobacco Use  . Smoking status: Former Research scientist (life sciences)  . Smokeless tobacco: Never Used  Substance Use Topics  . Alcohol use: Yes    Comment: daily  . Drug use: No     Allergies   Penicillins   Review of Systems Review of Systems Ten  systems reviewed and are negative for acute change, except as noted in the HPI.    Physical Exam Updated Vital Signs BP (!) 159/80 (BP Location: Right Arm)   Pulse (!) 109   Temp 97.9 F (36.6 C) (Oral)   Resp 16   Ht 5\' 8"  (1.727 m)   Wt 86.2 kg (190 lb)   SpO2 94%   BMI 28.89 kg/m   Physical Exam  Constitutional: He is oriented to person, place, and time. He appears well-developed and well-nourished. No distress.  HENT:  Head: Normocephalic and atraumatic.  Eyes: Pupils are equal, round, and reactive to light. Conjunctivae and EOM are normal. No scleral icterus.  Neck: Normal range of motion. Neck supple.  Cardiovascular: Normal rate, regular rhythm and normal heart sounds.  Pulmonary/Chest:  Effort normal and breath sounds normal. No respiratory distress. He exhibits no tenderness.  Abdominal: Soft. There is no tenderness.  Musculoskeletal: He exhibits no edema.  Market warmth, ecchymosis , swelling and tenderness of the left upper arm without obvious deformity.  Exquisitely tender to palpation  Neurological: He is alert and oriented to person, place, and time.  Skin: Skin is warm and dry. He is not diaphoretic.  Psychiatric: His behavior is normal.  Skin tear just superior to the left elbow, no bony tenderness  Nursing note and vitals reviewed.    ED Treatments / Results  Labs (all labs ordered are listed, but only abnormal results are displayed) Labs Reviewed - No data to display  EKG None  Radiology No results found.  Procedures Procedures (including critical care time)   SPLINT APPLICATION Date/Time: 82:50 PM Authorized by: Margarita Mail Consent: Verbal consent obtained. Risks and benefits: risks, benefits and alternatives were discussed Consent given by: patient Splint applied by: orthopedic technician Location details: left arm Splint type: coaptation Supplies used: fiberglass Post-procedure: The splinted body part was neurovascularly unchanged following the procedure. Patient tolerance: Patient tolerated the procedure well with no immediate complications.    Medications Ordered in ED Medications  fentaNYL (SUBLIMAZE) injection 50 mcg (has no administration in time range)  ondansetron (ZOFRAN) injection 4 mg (has no administration in time range)     Initial Impression / Assessment and Plan / ED Course  I have reviewed the triage vital signs and the nursing notes.  Pertinent labs & imaging results that were available during my care of the patient were reviewed by me and considered in my medical decision making (see chart for details).     Patient with left humerus fracture.  Patient lives alone and uses a walker for ambulation.  Given his  left humeral fracture this would make return home very unsafe for the patient he would not be able to ambulate effectively.  Because of this is necessitated an inpatient admission and patient will be admitted.  He is hemodynamically stable throughout his visit.  Pain significantly improved with medications.  Final Clinical Impressions(s) / ED Diagnoses   Final diagnoses:  None    ED Discharge Orders    None       Margarita Mail, PA-C 10/20/17 2330

## 2017-10-20 NOTE — ED Provider Notes (Signed)
Medical screening examination/treatment/procedure(s) were conducted as a shared visit with non-physician practitioner(s) and myself.  I personally evaluated the patient during the encounter. Briefly, the patient is a 82 y.o. male with a history of gait instability requiring the use of a walker presents to the emergency department after a mechanical fall couple days ago resulting in left arm pain.  Plain film revealed a left humeral shaft fracture.  Case discussed with Dr Wynelle Link who requested a call of splint.  Given the patient lives alone and will have difficulty with ADLs, will discuss case with medicine for admission and placement into rehab facility..    EKG Interpretation None           Ryann Leavitt, Grayce Sessions, MD 10/20/17 2122

## 2017-10-20 NOTE — ED Triage Notes (Signed)
Patient BIB EMS from home. Patient reports tripping on a tennis ball and falling backwards on Saturday night. Patient reports left upper arm pain, bruising, and left upper arm skin tear. Patient reports he layed on the floor for 13 hours until he could get to the phone. EMS came and checked patient out on Sunday and reported he didn't need to go to the ER right away. Patient also reports he is not completely compliant with his medication. Patient called EMS today for increased left upper arm pain. Patient reports having previously broken his left humerus, and that his left arm stays swollen, but is not usually bruised. Patient denies blood thinners. Lower extremity edema noted. Patient denies CP/SOB.

## 2017-10-20 NOTE — ED Notes (Signed)
Bed: [facility] Expected date:  Expected time:  Means of arrival:  Comments: EMS fall  

## 2017-10-20 NOTE — H&P (Signed)
[facility]                                                                                    Patient Demographics  David Key, is a 82 y.o. male  CSN: [id number]  MRN: [id number]  DOB - [birth year]  Admit Date - 10/20/2017  Outpatient Primary MD for the patient is Wenda Low, MD   With History of -  Past Medical History:  Diagnosis Date  . Arthritis   . Hypertension       Past Surgical History:  Procedure Laterality Date  . HIP SURGERY      in for   Chief Complaint  Patient presents with  . Fall  . Arm Pain     HPI  David Key  is a 82 y.o. male, with past medical history significant for arthritis and hypertension who was brought to our emergency room after a mechanical fall on his already compromised left humerus and he stayed on the floor for 13 hours before he was found by his neighbor.  Patient denies any preceding chest pains or shortness of breath.  He just slept.  X-ray was not conclusive and the patient lives alone, patient is admitted for pain control, orthopedic consult and social service consult.    Review of Systems    In addition to the HPI above,  No Fever-chills, No Headache, No changes with Vision or hearing, No problems swallowing food or Liquids, No Chest pain, Cough or Shortness of Breath, No Abdominal pain, No Nausea or Vommitting, Bowel movements are regular, No Blood in stool or Urine, No dysuria, No new skin rashes or bruises, No new joints pains-aches,  No new weakness, tingling, numbness in any extremity, No recent weight gain or loss, No polyuria, polydypsia or polyphagia, No significant Mental Stressors.  A full 10 point Review of Systems was done, except as stated above, all other Review of Systems were negative.   Social History Social History   Tobacco Use  . Smoking status: Former Research scientist (life sciences)  . Smokeless tobacco: Never Used  Substance Use Topics  . Alcohol use: Yes    Comment: daily     Family  History History reviewed. No pertinent family history.   Prior to Admission medications   Medication Sig Start Date End Date Taking? Authorizing Provider  benazepril (LOTENSIN) 10 MG tablet Take 10 mg by mouth daily.   Yes [provider]  cholecalciferol (VITAMIN D) 1000 units tablet Take 2,000 Units by mouth daily.   Yes [provider]  furosemide (LASIX) 20 MG tablet Take 20 mg by mouth daily. 01/29/17  Yes [provider]  metoprolol tartrate (LOPRESSOR) 25 MG tablet Take 1 tablet (25 mg total) by mouth 2 (two) times daily. 03/01/17  Yes Lavina Hamman, MD  Multiple Vitamins-Minerals (ICAPS PO) Take 1 capsule by mouth daily.   Yes [provider]  Propylene Glycol (SYSTANE BALANCE) 0.6 % SOLN Place 1 drop into both eyes 2 (two) times daily.   Yes [provider]  sodium chloride (OCEAN) 0.65 % SOLN nasal spray Place 1 spray into both nostrils as needed for congestion.   Yes [provider]  feeding supplement, ENSURE ENLIVE, (ENSURE ENLIVE) LIQD Take 237 mLs by mouth 2 (two) times daily between meals. Patient not taking: Reported on 10/20/2017 03/01/17   Lavina Hamman, MD    Allergies  Allergen Reactions  . Penicillins Hives    Has patient had a PCN reaction causing immediate rash, facial/tongue/throat swelling, SOB or lightheadedness with hypotension: Yes Has patient had a PCN reaction causing severe rash involving mucus membranes or skin necrosis: Yes Has patient had a PCN reaction that required hospitalization: Yes, was already in hospital when reaction happened Has patient had a PCN reaction occurring within the last 10 years: No If all of the above answers are "NO", then may proceed with Cephalosporin use.     Physical Exam  Vitals  Blood pressure (!) 144/75, pulse (!) 111, temperature 97.9 F (36.6 C), temperature source Oral, resp. rate 16, height 5\' 8"  (1.727 m), weight 86.2 kg (190 lb), SpO2 95 %.   1. General  elderly gentleman in pain  2. Normal affect and insight, Not Suicidal or Homicidal, Awake Alert, Oriented X 3.  3. No F.N deficits, grossly, patient moving all extremities.  4. Ears and Eyes appear Normal, Conjunctivae clear, PERRLA. Moist Oral Mucosa.  5. Supple Neck, No JVD, No cervical lymphadenopathy appriciated, No Carotid Bruits.  6. Symmetrical Chest wall movement, Good air movement bilaterally, CTAB.  7. RRR, No Gallops, Rubs or Murmurs, No Parasternal Heave.  8. Positive Bowel Sounds, Abdomen Soft, Non tender, No organomegaly appriciated,No rebound -guarding or rigidity.  9.  No Cyanosis, Normal Skin Turgor, No Skin Rash or Bruise.  10. Good muscle tone, left arm in a sling    Data Review  CBC Recent Labs  Lab 10/20/17 1839  WBC 8.6  HGB 12.6*  HCT 37.6*  PLT 136*  MCV 91.5  MCH 30.7  MCHC 33.5  RDW 14.8  LYMPHSABS 1.0  MONOABS 1.4*  EOSABS 0.0  BASOSABS 0.0   ------------------------------------------------------------------------------------------------------------------  Chemistries  Recent Labs  Lab 10/20/17 1839  NA 135  K 3.8  CL 100*  CO2 25  GLUCOSE 112*  BUN 15  CREATININE 0.60*  CALCIUM 8.8*   ------------------------------------------------------------------------------------------------------------------ estimated creatinine clearance is 80 mL/min (A) (by C-G formula based on SCr of 0.6 mg/dL (L)). ------------------------------------------------------------------------------------------------------------------ No results for input(s): TSH, T4TOTAL, T3FREE, THYROIDAB in the last 72 hours.  Invalid input(s): FREET3   Coagulation profile No results for input(s): INR, PROTIME in the last 168 hours. ------------------------------------------------------------------------------------------------------------------- No results for input(s): DDIMER in the last 72  hours. -------------------------------------------------------------------------------------------------------------------  Cardiac Enzymes No results for input(s): CKMB, TROPONINI, MYOGLOBIN in the last 168 hours.  Invalid input(s): CK ------------------------------------------------------------------------------------------------------------------ Invalid input(s): POCBNP   ---------------------------------------------------------------------------------------------------------------  Urinalysis    Component Value Date/Time   COLORURINE YELLOW 02/24/2017 [place] (A) 02/24/2017 1218   LABSPEC 1.010 02/24/2017 1218   PHURINE 7.0 02/24/2017 [place] 02/24/2017 [place] 02/24/2017 [place] 02/24/2017 1218   KETONESUR 5 (A) 02/24/2017 1218   PROTEINUR NEGATIVE 02/24/2017 1218   UROBILINOGEN 1.0 12/05/2013 1127   NITRITE NEGATIVE 02/24/2017 1218   LEUKOCYTESUR TRACE (A) 02/24/2017 1218    ----------------------------------------------------------------------------------------------------------------   Imaging results:   Dg Humerus Left  Result Date: 10/20/2017 CLINICAL DATA:  Fall.  Humerus pain. EXAM: LEFT HUMERUS - 2+ VIEW COMPARISON:  None. FINDINGS: Chronic fracture deformity involving the mid shaft of the left humerus is identified. There has been interval callus formation when compared  with 12/05/2013. The distal fracture lines are indistinct compatible with healing. Along the proximal portion of the prior fracture site there is lateral displacement of the distal fracture fragments by 1/2 shaft's width. Here, superimposed acute fracture cannot be excluded. IMPRESSION: 1. Cannot exclude acute on chronic fracture deformity involving the mid shaft of the left humerus. If there is high clinical suspicion for acute fracture consider CT through this area. Electronically Signed   By: Kerby Moors M.D.   On: 10/20/2017  19:37      Assessment & Plan  1.  Left humerus fracture?  Acute on chronic 2.  History of hypertension  Plan Admit to [place] social worker Pain control Consult orthopedics in a.m.   DVT Prophylaxis Lovenox  AM Labs Ordered, also please review Full Orders    Code Status full  Disposition Plan: Home  Time spent in minutes : 36 minutes  Condition fair   @SIGNATURE @

## 2017-10-20 NOTE — ED Notes (Signed)
ED TO INPATIENT HANDOFF REPORT  Name/Age/Gender David Key 82 y.o. male  Code Status Code Status History    Date Active Date Inactive Code Status Order ID Comments User Context   02/24/2017 1514 03/01/2017 1821 Full Code [id number]  Merton Border, MD Inpatient   12/05/2013 0844 12/08/2013 1824 Full Code [id number]  Caren Griffins, MD ED      Home/SNF/Other Home  Chief Complaint Fall; Arm Pain  Level of Care/Admitting Diagnosis ED Disposition    ED Disposition Condition [place] [facility] Area: [facility] [[id number]]  Level of Care: Med-Surg [16]  Diagnosis: Humerus fracture [384536]  Admitting Physician: Merton Border [[id number]]  Attending Physician: Laren Everts, [place]  Estimated length of stay: past midnight tomorrow  Certification:: I certify this patient will need inpatient services for at least 2 midnights  PT Class (Do Not Modify): Inpatient [101]  PT Acc Code (Do Not Modify): Private [1]       Medical History Past Medical History:  Diagnosis Date  . Arthritis   . Hypertension     Allergies Allergies  Allergen Reactions  . Penicillins Hives    Has patient had a PCN reaction causing immediate rash, facial/tongue/throat swelling, SOB or lightheadedness with hypotension: Yes Has patient had a PCN reaction causing severe rash involving mucus membranes or skin necrosis: Yes Has patient had a PCN reaction that required hospitalization: Yes, was already in hospital when reaction happened Has patient had a PCN reaction occurring within the last 10 years: No If all of the above answers are "NO", then may proceed with Cephalosporin use.     IV Location/Drains/Wounds Patient Lines/Drains/Airways Status   Active Line/Drains/Airways    Name:   Placement date:   Placement time:   Site:   Days:   Peripheral IV 10/20/17 Right Forearm   10/20/17    1848    Forearm   less than 1   Wound / Incision (Open or Dehisced) 02/24/17 Non-pressure wound Back  Posterior Moisture associated breakdown   02/24/17    1500    Back   238   Wound / Incision (Open or Dehisced) 02/24/17 Non-pressure wound Buttocks Posterior Moisture associated breakdown   02/24/17    1500    Buttocks   238          Labs/Imaging Results for orders placed or performed during the hospital encounter of 10/20/17 (from the past 48 hour(s))  Basic metabolic panel     Status: Abnormal   Collection Time: 10/20/17  6:39 PM  Result Value Ref Range   Sodium 135 135 - 145 mmol/L   Potassium 3.8 3.5 - 5.1 mmol/L   Chloride 100 (L) 101 - 111 mmol/L   CO2 25 22 - 32 mmol/L   Glucose, Bld 112 (H) 65 - 99 mg/dL   BUN 15 6 - 20 mg/dL   Creatinine, Ser 0.60 (L) 0.61 - 1.24 mg/dL   Calcium 8.8 (L) 8.9 - 10.3 mg/dL   GFR calc non Af Amer >60 >60 mL/min   GFR calc Af Amer >60 >60 mL/min    Comment: (NOTE) The eGFR has been calculated using the CKD EPI equation. This calculation has not been validated in all clinical situations. eGFR's persistently <60 mL/min signify possible Chronic Kidney Disease.    Anion gap 10 5 - 15    Comment: Performed at [facility], [place] [address], [place], [place] [address]  CBC with Differential     Status: Abnormal  Collection Time: 10/20/17  6:39 PM  Result Value Ref Range   WBC 8.6 4.0 - 10.5 K/uL   RBC 4.11 (L) 4.22 - 5.81 MIL/uL   Hemoglobin 12.6 (L) 13.0 - 17.0 g/dL   HCT 37.6 (L) 39.0 - 52.0 %   MCV 91.5 78.0 - 100.0 fL   MCH 30.7 26.0 - 34.0 pg   MCHC 33.5 30.0 - 36.0 g/dL   RDW 14.8 11.5 - 15.5 %   Platelets 136 (L) 150 - 400 K/uL   Neutrophils Relative % 72 %   Neutro Abs 6.2 1.7 - 7.7 K/uL   Lymphocytes Relative 12 %   Lymphs Abs 1.0 0.7 - 4.0 K/uL   Monocytes Relative 16 %   Monocytes Absolute 1.4 (H) 0.1 - 1.0 K/uL   Eosinophils Relative 0 %   Eosinophils Absolute 0.0 0.0 - 0.7 K/uL   Basophils Relative 0 %   Basophils Absolute 0.0 0.0 - 0.1 K/uL    Comment: Performed at [facility], [place] [address], [place], [place] [address]  CK     Status: None   Collection Time: 10/20/17  6:39 PM  Result Value Ref Range   Total CK 188 49 - 397 U/L    Comment: Performed at [facility], [place] [address] [place], [place] [address]   Dg Humerus Left  Result Date: 10/20/2017 CLINICAL DATA:  Fall.  Humerus pain. EXAM: LEFT HUMERUS - 2+ VIEW COMPARISON:  None. FINDINGS: Chronic fracture deformity involving the mid shaft of the left humerus is identified. There has been interval callus formation when compared with 12/05/2013. The distal fracture lines are indistinct compatible with healing. Along the proximal portion of the prior fracture site there is lateral displacement of the distal fracture fragments by 1/2 shaft's width. Here, superimposed acute fracture cannot be excluded. IMPRESSION: 1. Cannot exclude acute on chronic fracture deformity involving the mid shaft of the left humerus. If there is high clinical suspicion for acute fracture consider CT through this area. Electronically Signed   By: Kerby Moors M.D.   On: 10/20/2017 19:37    Pending Labs Unresulted Labs (From admission, onward)   Start     Ordered   10/20/17 1815  Urinalysis, Routine w reflex microscopic  STAT,   STAT     10/20/17 1816   Signed and Held  Creatinine, serum  (enoxaparin (LOVENOX)    CrCl >/= 30 ml/min)  Weekly,   R    Comments:  while on enoxaparin therapy    Signed and Held      Vitals/Pain Today's Vitals   10/20/17 1739 10/20/17 1847 10/20/17 2236  BP: (!) 159/80 (!) 149/88 (!) 144/75  Pulse: (!) 109 (!) 107 (!) 111  Resp: _0 Temp: 97.9 F (36.6 C)    TempSrc: Oral    SpO2: 94% 96% 95%  Weight: 190 lb (86.2 kg)    Height: 5' 8" (1.727 m)      Isolation Precautions No active isolations  Medications Medications  fentaNYL (SUBLIMAZE) injection 50 mcg (50 mcg Intravenous Given 10/20/17 1848)  ondansetron (ZOFRAN) injection 4 mg (4 mg Intravenous Given 10/20/17 1848)   fentaNYL (SUBLIMAZE) injection 50 mcg (50 mcg Intravenous Given 10/20/17 2145)    Mobility walks

## 2017-10-21 ENCOUNTER — Encounter (HOSPITAL_COMMUNITY): Payer: Self-pay

## 2017-10-21 DIAGNOSIS — S42352A Displaced comminuted fracture of shaft of humerus, left arm, initial encounter for closed fracture: Secondary | ICD-10-CM

## 2017-10-21 LAB — URINALYSIS, ROUTINE W REFLEX MICROSCOPIC
Bilirubin Urine: NEGATIVE
GLUCOSE, UA: NEGATIVE mg/dL
KETONES UR: 5 mg/dL — AB
Nitrite: NEGATIVE
PH: 6 (ref 5.0–8.0)
Protein, ur: NEGATIVE mg/dL
SPECIFIC GRAVITY, URINE: 1.02 (ref 1.005–1.030)

## 2017-10-21 MED ORDER — SODIUM CHLORIDE 0.9 % IV BOLUS: 500.0000 mL | Freq: Once | INTRAVENOUS | Status: DC

## 2017-10-21 MED ORDER — POLYETHYLENE GLYCOL 3350 17 G PO PACK
17.0000 g | PACK | Freq: Two times a day (BID) | ORAL | Status: AC
  Administered 2017-10-21 – 2017-10-22 (×3): 17 g via ORAL
  Filled 2017-10-21 (×4): qty 1

## 2017-10-21 NOTE — Progress Notes (Signed)
[facility] PROGRESS NOTE    Progress Note  David Key  UVO:[id number] DOB: 1936-06-18 DOA: 10/20/2017 PCP: Wenda Low, MD     Brief Narrative:   David Key is an 82 y.o. male with past medical history significant for arthritis and hypertension who was brought to our emergency room after a mechanical fall     Assessment/Plan:   Active Problems:   Humerus fracture SW consulted, PT eval pending. Orthopedic recommendation pending   DVT prophylaxis: lovenox Family Communication:none Disposition Plan/Barrier to D/C: SNF in today Code Status:     Code Status Orders  (From admission, onward)        Start     Ordered   10/20/17 2347  Full code  Continuous     10/20/17 2346    Code Status History    Date Active Date Inactive Code Status Order ID Comments User Context   02/24/2017 1514 03/01/2017 1821 Full Code [id number]  Merton Border, MD Inpatient   12/05/2013 0844 12/08/2013 1824 Full Code [id number]  Caren Griffins, MD ED        IV Access:    Peripheral IV   Procedures and diagnostic studies:   Dg Humerus Left  Result Date: 10/20/2017 CLINICAL DATA:  Fall.  Humerus pain. EXAM: LEFT HUMERUS - 2+ VIEW COMPARISON:  None. FINDINGS: Chronic fracture deformity involving the mid shaft of the left humerus is identified. There has been interval callus formation when compared with 12/05/2013. The distal fracture lines are indistinct compatible with healing. Along the proximal portion of the prior fracture site there is lateral displacement of the distal fracture fragments by 1/2 shaft's width. Here, superimposed acute fracture cannot be excluded. IMPRESSION: 1. Cannot exclude acute on chronic fracture deformity involving the mid shaft of the left humerus. If there is high clinical suspicion for acute fracture consider CT through this area. Electronically Signed   By: Kerby Moors M.D.   On: 10/20/2017 19:37     Medical Consultants:     None.  Anti-Infectives:     Subjective:    David Key pain is controlled.  Objective:    Vitals:   10/20/17 2236 10/20/17 2345 10/21/17 0001 10/21/17 0554  BP: (!) 144/75 (!) 151/76 (!) 151/76 121/75  Pulse: (!) 111 98 98 85  Resp: 16 18  18   Temp:  (!) 97.5 F (36.4 C)  97.6 F (36.4 C)  TempSrc:  Oral  Oral  SpO2: 95% 95%  94%  Weight:  86.2 kg (190 lb)    Height:  5\' 8"  (1.727 m)      Intake/Output Summary (Last 24 hours) at 10/21/2017 0846 Last data filed at 10/21/2017 0800 Gross per 24 hour  Intake 1680 ml  Output 400 ml  Net 1280 ml   Filed Weights   10/20/17 1739 10/20/17 2345  Weight: 86.2 kg (190 lb) 86.2 kg (190 lb)    Exam: General exam: In no acute distress. Respiratory system: Good air movement and clear to auscultation. Cardiovascular system: S1 & S2 heard, RRR.  Gastrointestinal system: Abdomen is nondistended, soft and nontender.  Central nervous system: Alert and oriented. No focal neurological deficits. Extremities: No pedal edema. Skin: No rashes, lesions or ulcers Psychiatry: Judgement and insight appear normal. Mood & affect appropriate.    Data Reviewed:    Labs: Basic Metabolic Panel: Recent Labs  Lab 10/20/17 1839  NA 135  K 3.8  CL 100*  CO2 25  GLUCOSE 112*  BUN 15  CREATININE 0.60*  CALCIUM 8.8*   GFR Estimated Creatinine Clearance: 80 mL/min (A) (by C-G formula based on SCr of 0.6 mg/dL (L)). Liver Function Tests: No results for input(s): AST, ALT, ALKPHOS, BILITOT, PROT, ALBUMIN in the last 168 hours. No results for input(s): LIPASE, AMYLASE in the last 168 hours. No results for input(s): AMMONIA in the last 168 hours. Coagulation profile No results for input(s): INR, PROTIME in the last 168 hours.  CBC: Recent Labs  Lab 10/20/17 1839  WBC 8.6  NEUTROABS 6.2  HGB 12.6*  HCT 37.6*  MCV 91.5  PLT 136*   Cardiac Enzymes: Recent Labs  Lab 10/20/17 1839  CKTOTAL 188   BNP (last 3 results) No  results for input(s): PROBNP in the last 8760 hours. CBG: No results for input(s): GLUCAP in the last 168 hours. D-Dimer: No results for input(s): DDIMER in the last 72 hours. Hgb A1c: No results for input(s): HGBA1C in the last 72 hours. Lipid Profile: No results for input(s): CHOL, HDL, LDLCALC, TRIG, CHOLHDL, LDLDIRECT in the last 72 hours. Thyroid function studies: No results for input(s): TSH, T4TOTAL, T3FREE, THYROIDAB in the last 72 hours.  Invalid input(s): FREET3 Anemia work up: No results for input(s): VITAMINB12, FOLATE, FERRITIN, TIBC, IRON, RETICCTPCT in the last 72 hours. Sepsis Labs: Recent Labs  Lab 10/20/17 1839  WBC 8.6   Microbiology No results found for this or any previous visit (from the past 240 hour(s)).   Medications:   . benazepril  10 mg Oral Daily  . cholecalciferol  2,000 Units Oral Daily  . enoxaparin (LOVENOX) injection  40 mg Subcutaneous QHS  . furosemide  20 mg Oral Daily  . metoprolol tartrate  25 mg Oral BID  . polyvinyl alcohol  1 drop Both Eyes BID  . sodium chloride flush  3 mL Intravenous Q12H   Continuous Infusions: . sodium chloride        LOS: 1 day   Charlynne Cousins  [facility] Pager [phone]  *Please refer to [email or web address], password [email or web address] to get updated schedule on who will round on this patient, as hospitalists switch teams weekly. If 7PM-7AM, please contact night-coverage at [email or web address], password [email or web address] for any overnight needs.  10/21/2017, 8:46 AM

## 2017-10-21 NOTE — Clinical Social Work Note (Signed)
Clinical Social Work Assessment  Patient Details  Name: David Key MRN: [id number] Date of Birth: 10-Nov-1935  Date of referral:  10/21/17               Reason for consult:  Facility Placement                Permission sought to share information with:  Family Supports Permission granted to share information::  Yes, Verbal Permission Granted  Name::        Agency::     Relationship::     Contact Information:     Housing/Transportation Living arrangements for the past 2 months:  Single Family Home Source of Information:  Patient Patient Interpreter Needed:  None Criminal Activity/Legal Involvement Pertinent to Current Situation/Hospitalization:  No - Comment as needed Significant Relationships:  Other family members, Neighbor Lives with:  Self Do you feel safe going back to the place where you live?  Yes Need for family participation in patient care:  Yes (Comment)  Care giving concerns:  Patient lives home alone. Patient admitted to the hospital for femoral fracture after he slipped on a tennis ball and fell backward onto his left arm and hip. The patient was unable to push himself up and laid on the floor for 13 hours.  Patient uses a walker and has difficulty using his walker due to the pain.    Social Worker assessment / plan:  CSW met with the patient at bedside, explained role and reason for visit. Patient explained fall and severity of pain. Patient reports he is unable to use his walker due to the pain. The patient does not have family in the area, he lives alone.  The patient understands that physical therapy meet with him to complete and evaluation for SNF vs. Home Health.   Patient reports he has been to SNF before, "[facility]" after hip surgery therefore is familiar with the SNF process. CSW explain insurance process and medicare requiring a 3 night stay inpatient.  CSW to complete FL2.  Patient understands if there is no bed available at [facility] he will have to choose  another SNF.   Plan: Pending PT evaluation.   Employment status:  Retired Forensic scientist:  Medicare PT Recommendations:  [facility] / Referral to community resources:  [place]  Patient/Family's Response to care:  Agreeable and Responding well to care.   Patient/Family's Understanding of and Emotional Response to Diagnosis, Current Treatment, and Prognosis:  Patient is agreeable to SNF placement if he qualifies. Patient has good understanding of his diagnosis and pain. Patient is hopeful to get rehab at [facility] if space is available.  Emotional Assessment Appearance:  Appears stated age Attitude/Demeanor/Rapport:    Affect (typically observed):  Accepting,Calm, Pleasant Orientation:  Oriented to Self, Oriented to Place, Oriented to  Time, Oriented to Situation Alcohol / Substance use:  Not Applicable Psych involvement (Current and /or in the community):  No (Comment)  Discharge Needs  Concerns to be addressed:  Decision making concerns, Discharge Planning Concerns Readmission within the last 30 days:  No Current discharge risk:  Dependent with Mobility Barriers to Discharge:  Continued Medical Work up   [employer], LCSW 10/21/2017, 11:45 AM

## 2017-10-21 NOTE — Plan of Care (Signed)
Plan of care discussed.   

## 2017-10-21 NOTE — Progress Notes (Signed)
Text page to Dr. Aileen Fass concerning low BP. Order to check orthostatic vitals. Pt unable to stand after feeling lightheaded. BP still 60's over 40's with manual BP reading. Order to give 500 cc NS IV bolus.

## 2017-10-21 NOTE — Progress Notes (Signed)
Call to Dr. Aileen Fass concerning pt's urine output. Pt has had 400 cc amber urine so far today. Advised that pt may be dehydrated, as he laid on his floor for 13 hours before being found after a fall. MD advised that pt should continue eating and drinking as usual, no maintenance fluids were necessary, and he would reevaluate pt in the morning. Pt's current BP 116/73. No nausea. Pt states he feels better after bolus this am. Pt preparing to eat dinner.

## 2017-10-22 MED ORDER — BENAZEPRIL HCL 10 MG PO TABS
10.0000 mg | ORAL_TABLET | Freq: Every day | ORAL | Status: DC
  Administered 2017-10-22 – 2017-10-23 (×2): 10 mg via ORAL
  Filled 2017-10-22 (×2): qty 1

## 2017-10-22 MED ORDER — NYSTATIN 100000 UNIT/GM EX POWD
Freq: Three times a day (TID) | CUTANEOUS | Status: DC
  Administered 2017-10-22 – 2017-10-23 (×4): via TOPICAL
  Filled 2017-10-22: qty 15

## 2017-10-22 NOTE — Evaluation (Signed)
Physical Therapy Evaluation Patient Details Name: David Key MRN: [id number] DOB: March 03, 1936 Today's Date: 10/22/2017   History of Present Illness  82 yo male admitted after having a fall at home. Sustained a L humerus fx. Hx of arthritis, falls, R THA, L THA   Clinical Impression  On eval, pt required Mod assist +2 for mobility. He was able to stand and take several lateral steps along side of bed with a hemi-walker. Pain rated 7/10 during session. Discussed d/c plan-pt is agreeable to ST rehab at [facility]. Will follow during hospital stay.     Follow Up Recommendations SNF    Equipment Recommendations  None recommended by PT    Recommendations for Other Services       Precautions / Restrictions Precautions Precautions: Fall Required Braces or Orthoses: Other Brace/Splint;Sling Other Brace/Splint: L UE Restrictions Weight Bearing Restrictions: Yes LUE Weight Bearing: Non weight bearing      Mobility  Bed Mobility Overal bed mobility: Needs Assistance Bed Mobility: Supine to Sit;Sit to Supine     Supine to sit: Mod assist;+2 for physical assistance;+2 for safety/equipment;HOB elevated Sit to supine: Mod assist;+2 for physical assistance;+2 for safety/equipment;HOB elevated   General bed mobility comments: Assist for trunk and bil LEs. Utilized bedpad to aid with positioning, scooting. Increased time.   Transfers Overall transfer level: Needs assistance Equipment used: Hemi-walker Transfers: Sit to/from Stand Sit to Stand: From elevated surface;Mod assist;+2 physical assistance;+2 safety/equipment         General transfer comment: Assist to block feet, rise, stabilize, control descent. Vcs safety, technique, hand placement.   Ambulation/Gait Ambulation/Gait assistance: Min assist;+2 physical assistance;+2 safety/equipment   Assistive device: Hemi-walker       General Gait Details: side steps along side of bed with hemiwalker. VCs safety, technique.   Stairs             Wheelchair Mobility    Modified Rankin (Stroke Patients Only)       Balance Overall balance assessment: Needs assistance;History of Falls         Standing balance support: Single extremity supported Standing balance-Leahy Scale: Poor                               Pertinent Vitals/Pain Pain Assessment: 0-10 Pain Score: 7  Pain Location: L UE Pain Descriptors / Indicators: Aching;Discomfort;Guarding Pain Intervention(s): Limited activity within patient's tolerance;Repositioned    Home Living Family/patient expects to be discharged to:: Skilled nursing facility Living Arrangements: Alone   Type of Home: House Home Access: Ramped entrance     Home Layout: One level Home Equipment: Environmental consultant - 2 wheels;Wheelchair - manual;Hospital bed      Prior Function                 Hand Dominance        Extremity/Trunk Assessment   Upper Extremity Assessment Upper Extremity Assessment: LUE deficits/detail LUE: Unable to fully assess due to pain;Unable to fully assess due to immobilization    Lower Extremity Assessment Lower Extremity Assessment: Generalized weakness    Cervical / Trunk Assessment Cervical / Trunk Assessment: Normal  Communication   Communication: No difficulties  Cognition Arousal/Alertness: Awake/alert Behavior During Therapy: WFL for tasks assessed/performed Overall Cognitive Status: Within Functional Limits for tasks assessed  General Comments      Exercises     Assessment/Plan    PT Assessment Patient needs continued PT services  PT Problem List Decreased strength;Decreased balance;Decreased range of motion;Decreased mobility;Decreased activity tolerance;Pain;Decreased knowledge of use of DME       PT Treatment Interventions DME instruction;Gait training;Functional mobility training;Therapeutic activities;Balance training;Patient/family  education;Therapeutic exercise    PT Goals (Current goals can be found in the Care Plan section)  Acute Rehab PT Goals Patient Stated Goal: to regain independence. less pain.  PT Goal Formulation: With patient Time For Goal Achievement: 11/05/17 Potential to Achieve Goals: Good    Frequency Min 2X/week   Barriers to discharge        Co-evaluation               AM-PAC PT "6 Clicks" Daily Activity  Outcome Measure Difficulty turning over in bed (including adjusting bedclothes, sheets and blankets)?: Unable Difficulty moving from lying on back to sitting on the side of the bed? : Unable Difficulty sitting down on and standing up from a chair with arms (e.g., wheelchair, bedside commode, etc,.)?: Unable Help needed moving to and from a bed to chair (including a wheelchair)?: A Lot Help needed walking in hospital room?: A Lot Help needed climbing 3-5 steps with a railing? : Total 6 Click Score: 8    End of Session Equipment Utilized During Treatment: Gait belt(L UE sling) Activity Tolerance: Patient tolerated treatment well Patient left: in bed;with call bell/phone within reach;with bed alarm set   PT Visit Diagnosis: Muscle weakness (generalized) (M62.81);Difficulty in walking, not elsewhere classified (R26.2);Pain Pain - Right/Left: Left Pain - part of body: Shoulder    Time: 9983-3825 PT Time Calculation (min) (ACUTE ONLY): 22 min   Charges:   PT Evaluation $PT Eval Moderate Complexity: 1 Mod     PT G Codes:          Weston Anna, MPT Pager: [phone]

## 2017-10-22 NOTE — Progress Notes (Signed)
[facility] PROGRESS NOTE    Progress Note  David Key  OEU:[id number] DOB: [birth year] DOA: 10/20/2017 PCP: Wenda Low, MD     Brief Narrative:   David Key is an 82 y.o. male with past medical history significant for arthritis and hypertension who was brought to our emergency room after a mechanical fall     Assessment/Plan:   Active Problems:   Humerus fracture Physical therapy has been consulted awaiting recommendations. We will reconsult orthopedic surgery for further recommendations.   DVT prophylaxis: lovenox Family Communication:none Disposition Plan/Barrier to D/C: SNF in am Code Status:     Code Status Orders  (From admission, onward)        Start     Ordered   10/20/17 2347  Full code  Continuous     10/20/17 2346    Code Status History    Date Active Date Inactive Code Status Order ID Comments User Context   02/24/2017 1514 03/01/2017 1821 Full Code [id number]  Merton Border, MD Inpatient   12/05/2013 0844 12/08/2013 1824 Full Code [id number]  Caren Griffins, MD ED        IV Access:    Peripheral IV   Procedures and diagnostic studies:   Dg Humerus Left  Result Date: 10/20/2017 CLINICAL DATA:  Fall.  Humerus pain. EXAM: LEFT HUMERUS - 2+ VIEW COMPARISON:  None. FINDINGS: Chronic fracture deformity involving the mid shaft of the left humerus is identified. There has been interval callus formation when compared with 12/05/2013. The distal fracture lines are indistinct compatible with healing. Along the proximal portion of the prior fracture site there is lateral displacement of the distal fracture fragments by 1/2 shaft's width. Here, superimposed acute fracture cannot be excluded. IMPRESSION: 1. Cannot exclude acute on chronic fracture deformity involving the mid shaft of the left humerus. If there is high clinical suspicion for acute fracture consider CT through this area. Electronically Signed   By: Kerby Moors M.D.   On: 10/20/2017 19:37      Medical Consultants:    None.  Anti-Infectives:     Subjective:    David Key pain is controlled.  Objective:    Vitals:   10/21/17 1406 10/21/17 1758 10/21/17 2156 10/22/17 0552  BP: (!) 64/48 117/64 119/72 132/80  Pulse:   78 88  Resp:   19 16  Temp:   97.7 F (36.5 C) 97.7 F (36.5 C)  TempSrc:   Oral Oral  SpO2:   94% 94%  Weight:      Height:        Intake/Output Summary (Last 24 hours) at 10/22/2017 1016 Last data filed at 10/22/2017 1000 Gross per 24 hour  Intake 840 ml  Output 875 ml  Net -35 ml   Filed Weights   10/20/17 1739 10/20/17 2345  Weight: 86.2 kg (190 lb) 86.2 kg (190 lb)    Exam: General exam: In no acute distress. Respiratory system: Good air movement and clear to auscultation. Cardiovascular system: S1 & S2 heard, RRR.  Gastrointestinal system: Abdomen is nondistended, soft and nontender.  Central nervous system: Alert and oriented. No focal neurological deficits. Extremities: No pedal edema. Skin: No rashes, lesions or ulcers Psychiatry: Judgement and insight appear normal. Mood & affect appropriate.    Data Reviewed:    Labs: Basic Metabolic Panel: Recent Labs  Lab 10/20/17 1839  NA 135  K 3.8  CL 100*  CO2 25  GLUCOSE 112*  BUN 15  CREATININE 0.60*  CALCIUM 8.8*  GFR Estimated Creatinine Clearance: 80 mL/min (A) (by C-G formula based on SCr of 0.6 mg/dL (L)). Liver Function Tests: No results for input(s): AST, ALT, ALKPHOS, BILITOT, PROT, ALBUMIN in the last 168 hours. No results for input(s): LIPASE, AMYLASE in the last 168 hours. No results for input(s): AMMONIA in the last 168 hours. Coagulation profile No results for input(s): INR, PROTIME in the last 168 hours.  CBC: Recent Labs  Lab 10/20/17 1839  WBC 8.6  NEUTROABS 6.2  HGB 12.6*  HCT 37.6*  MCV 91.5  PLT 136*   Cardiac Enzymes: Recent Labs  Lab 10/20/17 1839  CKTOTAL 188   BNP (last 3 results) No results for input(s): PROBNP in  the last 8760 hours. CBG: No results for input(s): GLUCAP in the last 168 hours. D-Dimer: No results for input(s): DDIMER in the last 72 hours. Hgb A1c: No results for input(s): HGBA1C in the last 72 hours. Lipid Profile: No results for input(s): CHOL, HDL, LDLCALC, TRIG, CHOLHDL, LDLDIRECT in the last 72 hours. Thyroid function studies: No results for input(s): TSH, T4TOTAL, T3FREE, THYROIDAB in the last 72 hours.  Invalid input(s): FREET3 Anemia work up: No results for input(s): VITAMINB12, FOLATE, FERRITIN, TIBC, IRON, RETICCTPCT in the last 72 hours. Sepsis Labs: Recent Labs  Lab 10/20/17 1839  WBC 8.6   Microbiology No results found for this or any previous visit (from the past 240 hour(s)).   Medications:   . cholecalciferol  2,000 Units Oral Daily  . enoxaparin (LOVENOX) injection  40 mg Subcutaneous QHS  . metoprolol tartrate  25 mg Oral BID  . polyethylene glycol  17 g Oral BID  . polyvinyl alcohol  1 drop Both Eyes BID  . sodium chloride flush  3 mL Intravenous Q12H   Continuous Infusions: . sodium chloride    . sodium chloride        LOS: 2 days   Charlynne Cousins  [facility] Pager [phone]  *Please refer to [email or web address], password [email or web address] to get updated schedule on who will round on this patient, as hospitalists switch teams weekly. If 7PM-7AM, please contact night-coverage at [email or web address], password [email or web address] for any overnight needs.  10/22/2017, 10:16 AM

## 2017-10-22 NOTE — Clinical Social Work Placement (Addendum)
D/C Summary sent.  SNF unable to provided transport so patient is agreeable to transport by [facility]  [facility] arranged for transport.   CLINICAL SOCIAL WORK PLACEMENT  NOTE  Date:  10/22/2017  Patient Details  Name: David Key MRN: [id number] Date of Birth: [birth year]  Clinical Social Work is seeking post-discharge placement for this patient at the [place] level of care (*CSW will initial, date and re-position this form in  chart as items are completed):      Patient/family provided with [facility] Work Department's list of facilities offering this level of care within the geographic area requested by the patient (or if unable, by the patient's family).      Patient/family informed of their freedom to choose among providers that offer the needed level of care, that participate in Medicare, Medicaid or managed care program needed by the patient, have an available bed and are willing to accept the patient.      Patient/family informed of [facility]'s ownership interest in [facility] and [facility], as well as of the fact that they are under no obligation to receive care at these facilities.  PASRR submitted to EDS on       PASRR number received on       Existing PASRR number confirmed on 10/22/17     FL2 transmitted to all facilities in geographic area requested by pt/family on       FL2 transmitted to all facilities within larger geographic area on 10/22/17     Patient informed that his/her managed care company has contracts with or will negotiate with certain facilities, including the following:  [facility]     Yes   Patient/family informed of bed offers received.  Patient chooses bed at [facility]     Physician recommends and patient chooses bed at      Patient to be transferred to [facility] on  .  Patient to be transferred to facility by [facility]     Patient family notified on   of transfer.  10/23/2017  Name of family member  notified:      Patient to notify friend.  PHYSICIAN       Additional Comment:    _______________________________________________ Lia Hopping, LCSW 10/22/2017, 3:58 PM

## 2017-10-22 NOTE — Plan of Care (Signed)
Plan of care reviewed with pt; verbalized understanding.  Pain controlled at tolerable level.  Pt voiding; adequate UOP.  No concerns at this time.

## 2017-10-22 NOTE — NC FL2 (Signed)
[place] LEVEL OF CARE SCREENING TOOL     IDENTIFICATION  Patient Name: David Key Birthdate: Dec 12, 1935 Sex: male Admission Date (Current Location): 10/20/2017  [facility] and [place] Number:  Herbalist and Address:  [facility],  [address] [place], [place]      Provider Number: [id number]  Attending Physician Name and Address:  Charlynne Cousins, MD  Relative Name and Phone Number:       Current Level of Care: Hospital Recommended Level of Care: [place] Prior Approval Number:    Date Approved/Denied:   PASRR Number:   [id number]  Discharge Plan: SNF    Current Diagnoses: Patient Active Problem List   Diagnosis Date Noted  . Humerus fracture 10/20/2017  . Urinary tract infection 02/24/2017  . Closed fracture of left humerus 12/05/2013  . Pre-syncope 12/05/2013  . Hyponatremia 12/05/2013  . Alcohol abuse 12/05/2013  . Bilateral leg edema 12/05/2013  . Chronic venous stasis dermatitis 12/05/2013  . HTN (hypertension) 12/05/2013  . UTI (urinary tract infection) 12/05/2013    Orientation RESPIRATION BLADDER Height & Weight     Time, Situation, Place, Self  Normal Continent Weight: 190 lb (86.2 kg) Height:  5\' 8"  (172.7 cm)  BEHAVIORAL SYMPTOMS/MOOD NEUROLOGICAL BOWEL NUTRITION STATUS      Continent Diet(Heart Healthy )  AMBULATORY STATUS COMMUNICATION OF NEEDS Skin     Verbally Normal                       Personal Care Assistance Level of Assistance  Bathing, Dressing Bathing Assistance: Limited assistance   Dressing Assistance: Limited assistance     Functional Limitations Info  Sight, Hearing, Speech Sight Info: Adequate Hearing Info: Adequate Speech Info: Adequate    SPECIAL CARE FACTORS FREQUENCY  PT (By licensed PT), OT (By licensed OT)     PT Frequency: 5x/week OT Frequency: 5x/week            Contractures Contractures Info: Not present    Additional Factors  Info  Code Status, Allergies Code Status Info: Fullcode Allergies Info: Allergies: Penicillins           Current Medications (10/22/2017):  This is the current hospital active medication list Current Facility-Administered Medications  Medication Dose Route Frequency Provider Last Rate Last Dose  . 0.9 %  sodium chloride infusion  250 mL Intravenous PRN Merton Border, MD      . acetaminophen (TYLENOL) tablet 650 mg  650 mg Oral Q6H PRN Merton Border, MD       Or  . acetaminophen (TYLENOL) suppository 650 mg  650 mg Rectal Q6H PRN Merton Border, MD      . benazepril (LOTENSIN) tablet 10 mg  10 mg Oral Daily Charlynne Cousins, MD   10 mg at 10/22/17 1258  . cholecalciferol (VITAMIN D) tablet 2,000 Units  2,000 Units Oral Daily Merton Border, MD   2,000 Units at 10/22/17 0931  . enoxaparin (LOVENOX) injection 40 mg  40 mg Subcutaneous QHS Merton Border, MD   40 mg at 10/21/17 2233  . HYDROcodone-acetaminophen (NORCO/VICODIN) 5-325 MG per tablet 1-2 tablet  1-2 tablet Oral Q4H PRN Merton Border, MD   1 tablet at 10/22/17 1515  . metoprolol tartrate (LOPRESSOR) tablet 25 mg  25 mg Oral BID Merton Border, MD   25 mg at 10/22/17 0931  . morphine 2 MG/ML injection 2 mg  2 mg Intravenous Q4H PRN Merton Border, MD  2 mg at 10/21/17 0001  . nystatin (MYCOSTATIN/NYSTOP) topical powder   Topical TID Charlynne Cousins, MD      . ondansetron [facility]) tablet 4 mg  4 mg Oral Q6H PRN Merton Border, MD       Or  . ondansetron (ZOFRAN) injection 4 mg  4 mg Intravenous Q6H PRN Merton Border, MD      . polyethylene glycol (MIRALAX / GLYCOLAX) packet 17 g  17 g Oral BID Charlynne Cousins, MD   17 g at 10/21/17 2234  . polyvinyl alcohol (LIQUIFILM TEARS) 1.4 % ophthalmic solution 1 drop  1 drop Both Eyes BID Merton Border, MD   1 drop at 10/22/17 0933  . sodium chloride (OCEAN) 0.65 % nasal spray 1 spray  1 spray Each Nare PRN Merton Border, MD      . sodium chloride 0.9 % bolus 500 mL  500 mL Intravenous Once Aileen Fass,  Tammi Klippel, MD      . sodium chloride flush (NS) 0.9 % injection 3 mL  3 mL Intravenous Q12H Merton Border, MD   3 mL at 10/22/17 0933  . sodium chloride flush (NS) 0.9 % injection 3 mL  3 mL Intravenous PRN Merton Border, MD      . zolpidem (AMBIEN) tablet 5 mg  5 mg Oral QHS PRN Merton Border, MD         Discharge Medications: Please see discharge summary for a list of discharge medications.  Relevant Imaging Results:  Relevant Lab Results:   Additional Information ssn:[id number]  Lia Hopping, LCSW

## 2017-10-23 DIAGNOSIS — R609 Edema, unspecified: Secondary | ICD-10-CM | POA: Diagnosis not present

## 2017-10-23 DIAGNOSIS — R52 Pain, unspecified: Secondary | ICD-10-CM | POA: Diagnosis not present

## 2017-10-23 DIAGNOSIS — R4182 Altered mental status, unspecified: Secondary | ICD-10-CM | POA: Diagnosis not present

## 2017-10-23 DIAGNOSIS — R0902 Hypoxemia: Secondary | ICD-10-CM | POA: Diagnosis not present

## 2017-10-23 DIAGNOSIS — Z4682 Encounter for fitting and adjustment of non-vascular catheter: Secondary | ICD-10-CM | POA: Diagnosis not present

## 2017-10-23 DIAGNOSIS — H04129 Dry eye syndrome of unspecified lacrimal gland: Secondary | ICD-10-CM | POA: Diagnosis not present

## 2017-10-23 DIAGNOSIS — R2681 Unsteadiness on feet: Secondary | ICD-10-CM | POA: Diagnosis not present

## 2017-10-23 DIAGNOSIS — G8911 Acute pain due to trauma: Secondary | ICD-10-CM | POA: Diagnosis not present

## 2017-10-23 DIAGNOSIS — S42352A Displaced comminuted fracture of shaft of humerus, left arm, initial encounter for closed fracture: Secondary | ICD-10-CM | POA: Diagnosis not present

## 2017-10-23 DIAGNOSIS — E569 Vitamin deficiency, unspecified: Secondary | ICD-10-CM | POA: Diagnosis not present

## 2017-10-23 DIAGNOSIS — S42202A Unspecified fracture of upper end of left humerus, initial encounter for closed fracture: Secondary | ICD-10-CM | POA: Diagnosis not present

## 2017-10-23 DIAGNOSIS — R0602 Shortness of breath: Secondary | ICD-10-CM | POA: Diagnosis not present

## 2017-10-23 DIAGNOSIS — S42352D Displaced comminuted fracture of shaft of humerus, left arm, subsequent encounter for fracture with routine healing: Secondary | ICD-10-CM | POA: Diagnosis not present

## 2017-10-23 DIAGNOSIS — Z23 Encounter for immunization: Secondary | ICD-10-CM | POA: Diagnosis not present

## 2017-10-23 DIAGNOSIS — R112 Nausea with vomiting, unspecified: Secondary | ICD-10-CM | POA: Diagnosis not present

## 2017-10-23 DIAGNOSIS — R278 Other lack of coordination: Secondary | ICD-10-CM | POA: Diagnosis not present

## 2017-10-23 DIAGNOSIS — M6281 Muscle weakness (generalized): Secondary | ICD-10-CM | POA: Diagnosis not present

## 2017-10-23 DIAGNOSIS — Z9181 History of falling: Secondary | ICD-10-CM | POA: Diagnosis not present

## 2017-10-23 DIAGNOSIS — Z79899 Other long term (current) drug therapy: Secondary | ICD-10-CM | POA: Diagnosis not present

## 2017-10-23 DIAGNOSIS — K5900 Constipation, unspecified: Secondary | ICD-10-CM | POA: Diagnosis not present

## 2017-10-23 DIAGNOSIS — Z87891 Personal history of nicotine dependence: Secondary | ICD-10-CM | POA: Diagnosis not present

## 2017-10-23 DIAGNOSIS — R0981 Nasal congestion: Secondary | ICD-10-CM | POA: Diagnosis not present

## 2017-10-23 DIAGNOSIS — R000 Tachycardia, unspecified: Secondary | ICD-10-CM | POA: Diagnosis not present

## 2017-10-23 DIAGNOSIS — I10 Essential (primary) hypertension: Secondary | ICD-10-CM | POA: Diagnosis not present

## 2017-10-23 MED ORDER — NYSTATIN 100000 UNIT/GM EX POWD: Freq: Three times a day (TID) | CUTANEOUS | 0 refills | Status: AC

## 2017-10-23 MED ORDER — POLYETHYLENE GLYCOL 3350 17 G PO PACK: 17.0000 g | PACK | Freq: Two times a day (BID) | ORAL | 0 refills | Status: AC

## 2017-10-23 MED ORDER — HYDROCODONE-ACETAMINOPHEN 5-325 MG PO TABS: 1.0000 | ORAL_TABLET | ORAL | 0 refills | Status: AC | PRN

## 2017-10-23 NOTE — Discharge Summary (Signed)
Physician Discharge Summary  David Key DGU:[id number] DOB: [birth year] DOA: 10/20/2017  PCP: Wenda Low, MD  Admit date: 10/20/2017 Discharge date: 10/23/2017  Admitted From: [facility] Disposition:  SNF  Recommendations for Outpatient Follow-up:  1. Follow up with PCP in 1-2 weeks 2.   Home Health:No Equipment/Devices:none  Discharge Condition:stable CODE STATUS:full Diet recommendation: Heart Healthy  Brief/Interim Summary: 82 y.o. male with past medical history significant for arthritis and hypertension who was brought to our emergency room after a mechanical fall     Discharge Diagnoses:  Active Problems:   Humerus fracture Physical therapy has been consulted ren SNF. Orthopedics recommend follow up with them as an outpatient.     Discharge Instructions  Discharge Instructions    Diet - low sodium heart healthy   Complete by:  As directed    Increase activity slowly   Complete by:  As directed      Allergies as of 10/23/2017      Reactions   Penicillins Hives   Has patient had a PCN reaction causing immediate rash, facial/tongue/throat swelling, SOB or lightheadedness with hypotension: Yes Has patient had a PCN reaction causing severe rash involving mucus membranes or skin necrosis: Yes Has patient had a PCN reaction that required hospitalization: Yes, was already in hospital when reaction happened Has patient had a PCN reaction occurring within the last 10 years: No If all of the above answers are "NO", then may proceed with Cephalosporin use.      Medication List    TAKE these medications   benazepril 10 MG tablet Commonly known as:  LOTENSIN Take 10 mg by mouth daily.   cholecalciferol 1000 units tablet Commonly known as:  VITAMIN D Take 2,000 Units by mouth daily.   feeding supplement (ENSURE ENLIVE) Liqd Take 237 mLs by mouth 2 (two) times daily between meals.   furosemide 20 MG tablet Commonly known as:  LASIX Take 20 mg by mouth daily.    HYDROcodone-acetaminophen 5-325 MG tablet Commonly known as:  NORCO/VICODIN Take 1-2 tablets by mouth every 4 (four) hours as needed for moderate pain.   ICAPS PO Take 1 capsule by mouth daily.   metoprolol tartrate 25 MG tablet Commonly known as:  LOPRESSOR Take 1 tablet (25 mg total) by mouth 2 (two) times daily.   nystatin powder Commonly known as:  MYCOSTATIN/NYSTOP Apply topically 3 (three) times daily.   polyethylene glycol packet Commonly known as:  MIRALAX / GLYCOLAX Take 17 g by mouth 2 (two) times daily.   sodium chloride 0.65 % Soln nasal spray Commonly known as:  OCEAN Place 1 spray into both nostrils as needed for congestion.   SYSTANE BALANCE 0.6 % Soln Generic drug:  Propylene Glycol Place 1 drop into both eyes 2 (two) times daily.      Follow-up Information    Gaynelle Arabian, MD Follow up in 1 week(s).   Specialty:  Orthopedic Surgery Contact information: [address] [place] [address] [phone]        Anesthesia, Elmo Putt, MD .   Specialty:  Anesthesiology Contact information: [place] [address] [phone]          Allergies  Allergen Reactions  . Penicillins Hives    Has patient had a PCN reaction causing immediate rash, facial/tongue/throat swelling, SOB or lightheadedness with hypotension: Yes Has patient had a PCN reaction causing severe rash involving mucus membranes or skin necrosis: Yes Has patient had a PCN reaction that required hospitalization: Yes, was already  in hospital when reaction happened Has patient had a PCN reaction occurring within the last 10 years: No If all of the above answers are "NO", then may proceed with Cephalosporin use.     Consultations:  Ortho   Procedures/Studies: Dg Humerus Left  Result Date: 10/20/2017 CLINICAL DATA:  Fall.  Humerus pain. EXAM: LEFT HUMERUS - 2+ VIEW COMPARISON:  None. FINDINGS: Chronic fracture deformity involving the mid shaft of the  left humerus is identified. There has been interval callus formation when compared with 12/05/2013. The distal fracture lines are indistinct compatible with healing. Along the proximal portion of the prior fracture site there is lateral displacement of the distal fracture fragments by 1/2 shaft's width. Here, superimposed acute fracture cannot be excluded. IMPRESSION: 1. Cannot exclude acute on chronic fracture deformity involving the mid shaft of the left humerus. If there is high clinical suspicion for acute fracture consider CT through this area. Electronically Signed   By: Kerby Moors M.D.   On: 10/20/2017 19:37    (Echo, Carotid, EGD, Colonoscopy, ERCP)    Subjective: No complians  Discharge Exam: Vitals:   10/23/17 0631 10/23/17 1053  BP: 124/71 131/66  Pulse: 84 88  Resp: 19   Temp: 97.6 F (36.4 C)   SpO2: 96%    Vitals:   10/22/17 2122 10/22/17 2126 10/23/17 0631 10/23/17 1053  BP: 123/66 123/66 124/71 131/66  Pulse: 88 88 84 88  Resp:  18 19   Temp:  98.3 F (36.8 C) 97.6 F (36.4 C)   TempSrc:  Oral Oral   SpO2:  96% 96%   Weight:      Height:        General: Pt is alert, awake, not in acute distress Cardiovascular: RRR, S1/S2 +, no rubs, no gallops Respiratory: CTA bilaterally, no wheezing, no rhonchi Abdominal: Soft, NT, ND, bowel sounds + Extremities: no edema, no cyanosis    The results of significant diagnostics from this hospitalization (including imaging, microbiology, ancillary and laboratory) are listed below for reference.     Microbiology: No results found for this or any previous visit (from the past 240 hour(s)).   Labs: BNP (last 3 results) No results for input(s): BNP in the last 8760 hours. Basic Metabolic Panel: Recent Labs  Lab 10/20/17 1839  NA 135  K 3.8  CL 100*  CO2 25  GLUCOSE 112*  BUN 15  CREATININE 0.60*  CALCIUM 8.8*   Liver Function Tests: No results for input(s): AST, ALT, ALKPHOS, BILITOT, PROT, ALBUMIN in the  last 168 hours. No results for input(s): LIPASE, AMYLASE in the last 168 hours. No results for input(s): AMMONIA in the last 168 hours. CBC: Recent Labs  Lab 10/20/17 1839  WBC 8.6  NEUTROABS 6.2  HGB 12.6*  HCT 37.6*  MCV 91.5  PLT 136*   Cardiac Enzymes: Recent Labs  Lab 10/20/17 1839  CKTOTAL 188   BNP: Invalid input(s): POCBNP CBG: No results for input(s): GLUCAP in the last 168 hours. D-Dimer No results for input(s): DDIMER in the last 72 hours. Hgb A1c No results for input(s): HGBA1C in the last 72 hours. Lipid Profile No results for input(s): CHOL, HDL, LDLCALC, TRIG, CHOLHDL, LDLDIRECT in the last 72 hours. Thyroid function studies No results for input(s): TSH, T4TOTAL, T3FREE, THYROIDAB in the last 72 hours.  Invalid input(s): FREET3 Anemia work up No results for input(s): VITAMINB12, FOLATE, FERRITIN, TIBC, IRON, RETICCTPCT in the last 72 hours. Urinalysis    Component Value Date/Time  COLORURINE AMBER (A) 10/21/2017 0630   APPEARANCEUR CLOUDY (A) 10/21/2017 0630   LABSPEC 1.020 10/21/2017 0630   PHURINE 6.0 10/21/2017 0630   GLUCOSEU NEGATIVE 10/21/2017 0630   HGBUR SMALL (A) 10/21/2017 0630   BILIRUBINUR NEGATIVE 10/21/2017 0630   KETONESUR 5 (A) 10/21/2017 0630   PROTEINUR NEGATIVE 10/21/2017 0630   UROBILINOGEN 1.0 12/05/2013 1127   NITRITE NEGATIVE 10/21/2017 0630   LEUKOCYTESUR MODERATE (A) 10/21/2017 0630   Sepsis Labs Invalid input(s): PROCALCITONIN,  WBC,  LACTICIDVEN Microbiology No results found for this or any previous visit (from the past 240 hour(s)).   Time coordinating discharge:35 minutes  SIGNED:   Charlynne Cousins, MD  [facility] 10/23/2017, 12:52 PM Pager   If 7PM-7AM, please contact night-coverage [email or web address] Password [email or web address]

## 2017-10-23 NOTE — Progress Notes (Signed)
Patient left with [facility] via stretcher, to [facility] rehab facility. He left the floor in stable condition. Paperwork sent with [facility] staff member.

## 2017-10-23 NOTE — Care Management Important Message (Signed)
Important Message  Patient Details  Name: David Key MRN: [id number] Date of Birth: 04-Sep-1935   Medicare Important Message Given:  Yes    Kerin Salen 10/23/2017, 11:14 AMImportant Message  Patient Details  Name: David Key MRN: [id number] Date of Birth: 1936/04/07   Medicare Important Message Given:  Yes    Kerin Salen 10/23/2017, 11:14 AM

## 2017-10-23 NOTE — Progress Notes (Signed)
Report called to lisa t,rn.

## 2017-10-24 DIAGNOSIS — E569 Vitamin deficiency, unspecified: Secondary | ICD-10-CM | POA: Diagnosis not present

## 2017-10-24 DIAGNOSIS — K5900 Constipation, unspecified: Secondary | ICD-10-CM | POA: Diagnosis not present

## 2017-10-24 DIAGNOSIS — R0981 Nasal congestion: Secondary | ICD-10-CM | POA: Diagnosis not present

## 2017-10-24 DIAGNOSIS — I10 Essential (primary) hypertension: Secondary | ICD-10-CM | POA: Diagnosis not present

## 2017-10-24 DIAGNOSIS — R52 Pain, unspecified: Secondary | ICD-10-CM | POA: Diagnosis not present

## 2017-10-24 DIAGNOSIS — M6281 Muscle weakness (generalized): Secondary | ICD-10-CM | POA: Diagnosis not present

## 2017-10-24 DIAGNOSIS — R609 Edema, unspecified: Secondary | ICD-10-CM | POA: Diagnosis not present

## 2017-10-24 DIAGNOSIS — H04129 Dry eye syndrome of unspecified lacrimal gland: Secondary | ICD-10-CM | POA: Diagnosis not present

## 2017-10-24 DIAGNOSIS — S42202A Unspecified fracture of upper end of left humerus, initial encounter for closed fracture: Secondary | ICD-10-CM | POA: Diagnosis not present

## 2017-10-25 LAB — URINE CULTURE

## 2017-10-27 DIAGNOSIS — R52 Pain, unspecified: Secondary | ICD-10-CM | POA: Diagnosis not present

## 2017-10-27 DIAGNOSIS — R609 Edema, unspecified: Secondary | ICD-10-CM | POA: Diagnosis not present

## 2017-10-27 DIAGNOSIS — M6281 Muscle weakness (generalized): Secondary | ICD-10-CM | POA: Diagnosis not present

## 2017-10-27 DIAGNOSIS — K5900 Constipation, unspecified: Secondary | ICD-10-CM | POA: Diagnosis not present

## 2017-10-27 DIAGNOSIS — I10 Essential (primary) hypertension: Secondary | ICD-10-CM | POA: Diagnosis not present

## 2017-10-27 DIAGNOSIS — S42202A Unspecified fracture of upper end of left humerus, initial encounter for closed fracture: Secondary | ICD-10-CM | POA: Diagnosis not present

## 2017-10-27 DIAGNOSIS — E569 Vitamin deficiency, unspecified: Secondary | ICD-10-CM | POA: Diagnosis not present

## 2017-10-27 DIAGNOSIS — R0981 Nasal congestion: Secondary | ICD-10-CM | POA: Diagnosis not present

## 2017-10-27 DIAGNOSIS — H04129 Dry eye syndrome of unspecified lacrimal gland: Secondary | ICD-10-CM | POA: Diagnosis not present

## 2017-10-29 DIAGNOSIS — K5900 Constipation, unspecified: Secondary | ICD-10-CM | POA: Diagnosis not present

## 2017-10-29 DIAGNOSIS — S42202A Unspecified fracture of upper end of left humerus, initial encounter for closed fracture: Secondary | ICD-10-CM | POA: Diagnosis not present

## 2017-10-29 DIAGNOSIS — R609 Edema, unspecified: Secondary | ICD-10-CM | POA: Diagnosis not present

## 2017-10-29 DIAGNOSIS — R52 Pain, unspecified: Secondary | ICD-10-CM | POA: Diagnosis not present

## 2017-10-29 DIAGNOSIS — H04129 Dry eye syndrome of unspecified lacrimal gland: Secondary | ICD-10-CM | POA: Diagnosis not present

## 2017-10-29 DIAGNOSIS — M6281 Muscle weakness (generalized): Secondary | ICD-10-CM | POA: Diagnosis not present

## 2017-10-29 DIAGNOSIS — I10 Essential (primary) hypertension: Secondary | ICD-10-CM | POA: Diagnosis not present

## 2017-10-29 DIAGNOSIS — R0981 Nasal congestion: Secondary | ICD-10-CM | POA: Diagnosis not present

## 2017-10-31 DIAGNOSIS — H04129 Dry eye syndrome of unspecified lacrimal gland: Secondary | ICD-10-CM | POA: Diagnosis not present

## 2017-10-31 DIAGNOSIS — R52 Pain, unspecified: Secondary | ICD-10-CM | POA: Diagnosis not present

## 2017-10-31 DIAGNOSIS — E569 Vitamin deficiency, unspecified: Secondary | ICD-10-CM | POA: Diagnosis not present

## 2017-10-31 DIAGNOSIS — R0981 Nasal congestion: Secondary | ICD-10-CM | POA: Diagnosis not present

## 2017-10-31 DIAGNOSIS — S42202A Unspecified fracture of upper end of left humerus, initial encounter for closed fracture: Secondary | ICD-10-CM | POA: Diagnosis not present

## 2017-10-31 DIAGNOSIS — R609 Edema, unspecified: Secondary | ICD-10-CM | POA: Diagnosis not present

## 2017-10-31 DIAGNOSIS — K5900 Constipation, unspecified: Secondary | ICD-10-CM | POA: Diagnosis not present

## 2017-10-31 DIAGNOSIS — I10 Essential (primary) hypertension: Secondary | ICD-10-CM | POA: Diagnosis not present

## 2017-10-31 DIAGNOSIS — M6281 Muscle weakness (generalized): Secondary | ICD-10-CM | POA: Diagnosis not present

## 2017-11-02 ENCOUNTER — Emergency Department (HOSPITAL_COMMUNITY): Payer: Medicare Other

## 2017-11-02 ENCOUNTER — Emergency Department (HOSPITAL_COMMUNITY)
Admission: EM | Admit: 2017-11-02 | Discharge: 2017-11-12 | Disposition: E | Payer: Medicare Other | Attending: Physician Assistant | Admitting: Physician Assistant

## 2017-11-02 DIAGNOSIS — R0602 Shortness of breath: Secondary | ICD-10-CM | POA: Diagnosis not present

## 2017-11-02 DIAGNOSIS — Z4682 Encounter for fitting and adjustment of non-vascular catheter: Secondary | ICD-10-CM | POA: Diagnosis not present

## 2017-11-02 DIAGNOSIS — Z79899 Other long term (current) drug therapy: Secondary | ICD-10-CM | POA: Insufficient documentation

## 2017-11-02 DIAGNOSIS — I10 Essential (primary) hypertension: Secondary | ICD-10-CM | POA: Insufficient documentation

## 2017-11-02 DIAGNOSIS — R52 Pain, unspecified: Secondary | ICD-10-CM | POA: Insufficient documentation

## 2017-11-02 DIAGNOSIS — Z87891 Personal history of nicotine dependence: Secondary | ICD-10-CM | POA: Insufficient documentation

## 2017-11-02 DIAGNOSIS — R000 Tachycardia, unspecified: Secondary | ICD-10-CM | POA: Diagnosis not present

## 2017-11-02 LAB — CBC WITH DIFFERENTIAL/PLATELET
BASOS PCT: 0 %
Basophils Absolute: 0 10*3/uL (ref 0.0–0.1)
EOS PCT: 0 %
Eosinophils Absolute: 0 10*3/uL (ref 0.0–0.7)
HEMATOCRIT: 12.3 % — AB (ref 39.0–52.0)
HEMOGLOBIN: 3.9 g/dL — AB (ref 13.0–17.0)
LYMPHS ABS: 0.7 10*3/uL (ref 0.7–4.0)
LYMPHS PCT: 6 %
MCH: 30.2 pg (ref 26.0–34.0)
MCHC: 31.7 g/dL (ref 30.0–36.0)
MCV: 95.3 fL (ref 78.0–100.0)
MONOS PCT: 3 %
Monocytes Absolute: 0.3 10*3/uL (ref 0.1–1.0)
NEUTROS ABS: 9.9 10*3/uL — AB (ref 1.7–7.7)
Neutrophils Relative %: 91 %
Platelets: 131 10*3/uL — ABNORMAL LOW (ref 150–400)
RBC: 1.29 MIL/uL — ABNORMAL LOW (ref 4.22–5.81)
RDW: 14.7 % (ref 11.5–15.5)
WBC: 10.9 10*3/uL — ABNORMAL HIGH (ref 4.0–10.5)

## 2017-11-02 LAB — COMPREHENSIVE METABOLIC PANEL
ALT: 5 U/L — ABNORMAL LOW (ref 17–63)
ANION GAP: 4 — AB (ref 5–15)
AST: 12 U/L — AB (ref 15–41)
Alkaline Phosphatase: 25 U/L — ABNORMAL LOW (ref 38–126)
BILIRUBIN TOTAL: 0.3 mg/dL (ref 0.3–1.2)
BUN: 19 mg/dL (ref 6–20)
CO2: 10 mmol/L — AB (ref 22–32)
Calcium: <4 mg/dL — CL (ref 8.9–10.3)
Chloride: 129 mmol/L — ABNORMAL HIGH (ref 101–111)
Creatinine, Ser: 0.55 mg/dL — ABNORMAL LOW (ref 0.61–1.24)
GFR calc Af Amer: >60 mL/min (ref >60)
GFR calc non Af Amer: >60 mL/min (ref >60)
GLUCOSE: 81 mg/dL (ref 65–99)
SODIUM: 143 mmol/L (ref 135–145)

## 2017-11-02 LAB — PROTIME-INR
INR: 2.84
Prothrombin Time: 29.6 seconds — ABNORMAL HIGH (ref 11.4–15.2)

## 2017-11-02 LAB — I-STAT VENOUS BLOOD GAS, ED
Acid-base deficit: 18 mmol/L — ABNORMAL HIGH (ref 0.0–2.0)
Bicarbonate: 9.1 mmol/L — ABNORMAL LOW (ref 20.0–28.0)
O2 SAT: 80 %
TCO2: 10 mmol/L — AB (ref 22–32)
pCO2, Ven: 28.9 mmHg — ABNORMAL LOW (ref 44.0–60.0)
pH, Ven: 7.105 — CL (ref 7.250–7.430)
pO2, Ven: 59 mmHg — ABNORMAL HIGH (ref 32.0–45.0)

## 2017-11-02 LAB — I-STAT ARTERIAL BLOOD GAS, ED
ACID-BASE DEFICIT: 5 mmol/L — AB (ref 0.0–2.0)
BICARBONATE: 24.1 mmol/L (ref 20.0–28.0)
O2 Saturation: 95 %
PO2 ART: 94 mmHg (ref 83.0–108.0)
Patient temperature: 98.6
TCO2: 26 mmol/L (ref 22–32)
pCO2 arterial: 59.2 mmHg — ABNORMAL HIGH (ref 32.0–48.0)
pH, Arterial: 7.218 — ABNORMAL LOW (ref 7.350–7.450)

## 2017-11-02 LAB — AMMONIA: AMMONIA: 34 umol/L (ref 9–35)

## 2017-11-02 LAB — D-DIMER, QUANTITATIVE (NOT AT ARMC): D DIMER QUANT: 13.2 ug{FEU}/mL — AB (ref 0.00–0.50)

## 2017-11-02 LAB — I-STAT TROPONIN, ED: Troponin i, poc: 0 ng/mL (ref 0.00–0.08)

## 2017-11-02 LAB — I-STAT CG4 LACTIC ACID, ED: LACTIC ACID, VENOUS: 3.47 mmol/L — AB (ref 0.5–1.9)

## 2017-11-02 LAB — LIPASE, BLOOD: LIPASE: 20 U/L (ref 11–51)

## 2017-11-02 LAB — BRAIN NATRIURETIC PEPTIDE: B Natriuretic Peptide: 62.5 pg/mL (ref 0.0–100.0)

## 2017-11-02 LAB — TROPONIN I: Troponin I: <0.03 ng/mL (ref <0.03)

## 2017-11-02 MED ORDER — PROPOFOL 1000 MG/100ML IV EMUL
INTRAVENOUS | Status: AC
  Filled 2017-11-02: qty 100

## 2017-11-02 MED ORDER — FENTANYL CITRATE (PF) 100 MCG/2ML IJ SOLN
INTRAMUSCULAR | Status: AC
  Filled 2017-11-02: qty 2

## 2017-11-02 MED ORDER — NOREPINEPHRINE BITARTRATE 1 MG/ML IV SOLN
0.0000 ug/min | Freq: Once | INTRAVENOUS | Status: AC
  Administered 2017-11-02: 15 ug/min via INTRAVENOUS

## 2017-11-02 MED ORDER — IOPAMIDOL (ISOVUE-370) INJECTION 76%
INTRAVENOUS | Status: AC
  Filled 2017-11-02: qty 100

## 2017-11-02 MED ORDER — SODIUM CHLORIDE 0.9 % IV BOLUS
1000.0000 mL | Freq: Once | INTRAVENOUS | Status: AC
  Administered 2017-11-03: 1000 mL via INTRAVENOUS

## 2017-11-02 MED ORDER — SODIUM CHLORIDE 0.9 % IV SOLN: 10.0000 mL/h | Freq: Once | INTRAVENOUS | Status: DC

## 2017-11-02 MED ORDER — FENTANYL CITRATE (PF) 100 MCG/2ML IJ SOLN
50.0000 ug | Freq: Once | INTRAMUSCULAR | Status: AC
  Administered 2017-11-02: 50 ug via INTRAVENOUS

## 2017-11-02 MED ORDER — EPINEPHRINE PF 1 MG/ML IJ SOLN
0.5000 ug/min | INTRAVENOUS | Status: DC
  Administered 2017-11-02: 1.3 ug/min via INTRAVENOUS
  Filled 2017-11-02: qty 4

## 2017-11-02 MED ORDER — PROPOFOL 1000 MG/100ML IV EMUL
5.0000 ug/kg/min | Freq: Once | INTRAVENOUS | Status: AC
  Administered 2017-11-02: 5 ug/kg/min via INTRAVENOUS
  Filled 2017-11-02: qty 100

## 2017-11-02 NOTE — ED Provider Notes (Addendum)
[place] EMERGENCY DEPARTMENT Provider Note   CSN: [id number] Arrival date & time: 11/06/2017  2200     History   Chief Complaint No chief complaint on file.   HPI David Key is a 82 y.o. male.  HPI   Patient is a 82 year old male who came in for shortness of breath.  Patient had no pulse on arrival.  Patient was on a CPAP.  Patient EMS called out to facility, rehab facility for shortness of breath.  Patient was in rehab facility for humeral fracture.  Patient pulseless, tachycardic on arrival.  Past Medical History:  Diagnosis Date  . Arthritis   . Hypertension     Patient Active Problem List   Diagnosis Date Noted  . Humerus fracture 10/20/2017  . Urinary tract infection 02/24/2017  . Closed fracture of left humerus 12/05/2013  . Pre-syncope 12/05/2013  . Hyponatremia 12/05/2013  . Alcohol abuse 12/05/2013  . Bilateral leg edema 12/05/2013  . Chronic venous stasis dermatitis 12/05/2013  . HTN (hypertension) 12/05/2013  . UTI (urinary tract infection) 12/05/2013    Past Surgical History:  Procedure Laterality Date  . HIP SURGERY          Home Medications    Prior to Admission medications   Medication Sig Start Date End Date Taking? Authorizing Provider  benazepril (LOTENSIN) 10 MG tablet Take 10 mg by mouth daily.   Yes [provider]  cholecalciferol (VITAMIN D) 1000 units tablet Take 2,000 Units by mouth daily.   Yes [provider]  furosemide (LASIX) 20 MG tablet Take 20 mg by mouth daily. 01/29/17  Yes [provider]  HYDROcodone-acetaminophen (NORCO/VICODIN) 5-325 MG tablet Take 1-2 tablets by mouth every 4 (four) hours as needed for moderate pain. 10/23/17  Yes Charlynne Cousins, MD  loperamide (IMODIUM A-D) 2 MG tablet Take 2 mg by mouth 4 (four) times daily as needed for diarrhea or loose stools.   Yes [provider]  magnesium hydroxide (MILK OF MAGNESIA) 400 MG/5ML suspension Take 30  mLs by mouth daily as needed for mild constipation.   Yes [provider]  metoprolol tartrate (LOPRESSOR) 25 MG tablet Take 1 tablet (25 mg total) by mouth 2 (two) times daily. 03/01/17  Yes Lavina Hamman, MD  Multiple Vitamins-Minerals (ICAPS PO) Take 1 capsule by mouth daily.   Yes [provider]  ondansetron (ZOFRAN) 4 MG tablet Take 4 mg by mouth every 6 (six) hours as needed for nausea or vomiting.   Yes [provider]  polyethylene glycol (MIRALAX / GLYCOLAX) packet Take 17 g by mouth 2 (two) times daily. 10/23/17  Yes Charlynne Cousins, MD  Propylene Glycol (SYSTANE BALANCE) 0.6 % SOLN Place 1 drop into both eyes 2 (two) times daily.   Yes [provider]  sodium chloride (OCEAN) 0.65 % SOLN nasal spray Place 1 spray into both nostrils as needed for congestion.   Yes [provider]  sodium phosphate (FLEET) 7-19 GM/118ML ENEM Place 1 enema rectally once.   Yes [provider]  tuberculin (APLISOL) 5 UNIT/0.1ML injection Inject 5 Units into the skin once.   Yes [provider]  UNABLE TO FIND Take 60 mLs by mouth 3 (three) times daily. Med Name: MedPass   Yes [provider]  feeding supplement, ENSURE ENLIVE, (ENSURE ENLIVE) LIQD Take 237 mLs by mouth 2 (two) times daily between meals. Patient not taking: Reported on 10/20/2017 03/01/17   Lavina Hamman, MD  nystatin (  MYCOSTATIN/NYSTOP) powder Apply topically 3 (three) times daily. Patient not taking: Reported on 2017-11-21 10/23/17   Charlynne Cousins, MD    Family History No family history on file.  Social History Social History   Tobacco Use  . Smoking status: Former Research scientist (life sciences)  . Smokeless tobacco: Never Used  Substance Use Topics  . Alcohol use: Yes    Alcohol/week: 12.6 oz    Types: 21 Glasses of wine per week    Comment: 3 glasses per day  . Drug use: No     Allergies   Penicillins   Review of Systems Review of Systems  Unable to perform  ROS: Intubated     Physical Exam Updated Vital Signs BP (!) 34/22   Pulse (!) 106   Temp 99.5 F (37.5 C)   Resp (!) 25   SpO2 (!) 74%   Physical Exam  Constitutional: He appears well-nourished.  Blue, CPAP max full of vomit.  HENT:  Head: Normocephalic.  Eyes:  Pupils appear fixed.  Cardiovascular:  Tachycardic regular.  Pulmonary/Chest:  Patient intubated.  Bilateral crackles and rhonchi  Abdominal: He exhibits distension.  Abdomen grossly distended.  Neurological:  Not alert nor oriented.  Skin: He is not diaphoretic.  Initially pale/blue.  Nursing note and vitals reviewed.    ED Treatments / Results  Labs (all labs ordered are listed, but only abnormal results are displayed) Labs Reviewed  COMPREHENSIVE METABOLIC PANEL - Abnormal; Notable for the following components:      Result Value   Potassium <2.0 (*)    Chloride 129 (*)    CO2 10 (*)    Creatinine, Ser 0.55 (*)    Calcium <4.0 (*)    Total Protein <3.0 (*)    Albumin <1.0 (*)    AST 12 (*)    ALT 5 (*)    Alkaline Phosphatase 25 (*)    Anion gap 4 (*)    All other components within normal limits  CBC WITH DIFFERENTIAL/PLATELET - Abnormal; Notable for the following components:   WBC 10.9 (*)    RBC 1.29 (*)    Hemoglobin 3.9 (*)    HCT 12.3 (*)    Platelets 131 (*)    Neutro Abs 9.9 (*)    All other components within normal limits  D-DIMER, QUANTITATIVE (NOT AT [facility]) - Abnormal; Notable for the following components:   D-Dimer, Quant 13.20 (*)    All other components within normal limits  PROTIME-INR - Abnormal; Notable for the following components:   Prothrombin Time 29.6 (*)    All other components within normal limits  CBC WITH DIFFERENTIAL/PLATELET - Abnormal; Notable for the following components:   RBC 1.97 (*)    Hemoglobin 6.0 (*)    HCT 18.7 (*)    All other components within normal limits  I-STAT CG4 LACTIC ACID, ED - Abnormal; Notable for the following components:   Lactic  Acid, Venous 3.47 (*)    All other components within normal limits  I-STAT VENOUS BLOOD GAS, ED - Abnormal; Notable for the following components:   pH, Ven 7.105 (*)    pCO2, Ven 28.9 (*)    pO2, Ven 59.0 (*)    Bicarbonate 9.1 (*)    TCO2 10 (*)    Acid-base deficit 18.0 (*)    All other components within normal limits  I-STAT ARTERIAL BLOOD GAS, ED - Abnormal; Notable for the following components:   pH, Arterial 7.218 (*)    pCO2 arterial 59.2 (*)  Acid-base deficit 5.0 (*)    All other components within normal limits  CULTURE, BLOOD (ROUTINE X 2)  CULTURE, BLOOD (ROUTINE X 2)  CULTURE, RESPIRATORY (NON-EXPECTORATED)  AMMONIA  LIPASE, BLOOD  BRAIN NATRIURETIC PEPTIDE  PROCALCITONIN  TROPONIN I  URINALYSIS, ROUTINE W REFLEX MICROSCOPIC  RAPID URINE DRUG SCREEN, HOSP PERFORMED  LACTIC ACID, PLASMA  PROCALCITONIN  TROPONIN I  TROPONIN I  I-STAT TROPONIN, ED  I-STAT CHEM 8, ED  PREPARE RBC (CROSSMATCH)    EKG EKG Interpretation  Date/Time:  Sunday November 02 2017 23:19:28 EDT Ventricular Rate:  113 PR Interval:    QRS Duration: 84 QT Interval:  332 QTC Calculation: 456 R Axis:   68 Text Interpretation:  Atrial flutter Abnormal R-wave progression, early transition st depression improved since last ekg.  Confirmed by Zenovia Jarred [phone]) on 11/04/2017 11:22:32 PM   Radiology Dg Chest Portable 1 View  Result Date: 10/16/2017 CLINICAL DATA:  Endotracheal tube placement. Orogastric tube placement. Status post code. EXAM: PORTABLE CHEST 1 VIEW COMPARISON:  Chest radiograph performed 12/05/2013, and CT of the chest performed 02/15/2011 FINDINGS: The patient's endotracheal tube is seen ending 3-4 cm above the carina. An enteric tube is noted ending overlying the body of the stomach. Bilateral lower lung zone airspace opacification may reflect pulmonary edema or pneumonia. A small left pleural effusion is suspected. No pneumothorax is seen. Cardiomediastinal silhouette is  normal in size. No acute osseous abnormalities are identified. An external pacing pad is noted. IMPRESSION: 1. Endotracheal tube seen ending 3-4 cm above the carina. 2. Enteric tube noted ending overlying the body of the stomach. 3. Bilateral lower lung zone airspace opacification may reflect pulmonary edema or pneumonia. Suspect small left pleural effusion. Electronically Signed   By: Garald Balding M.D.   On: 10/25/2017 23:17   Dg Abd Portable 1v  Result Date: 10/16/2017 CLINICAL DATA:  Orogastric tube placement.  Status post code. EXAM: PORTABLE ABDOMEN - 1 VIEW COMPARISON:  None. FINDINGS: The patient's enteric tube is noted ending overlying the body of the stomach on the concurrent chest radiograph. There is diffuse distention of small-bowel loops with air, possibly reflecting ingestion of air during CPR. An external pacing pad is noted. No acute osseous abnormalities are seen. Flowing osteophytes are noted along the lower thoracic and lumbar spine. Brachytherapy seeds are noted at the prostate bed. The patient's bilateral hip arthroplasties are grossly unremarkable, though incompletely imaged on this study. IMPRESSION: 1. Enteric tube noted ending overlying the body of the stomach on the concurrent chest radiograph. 2. Diffuse distention of small-bowel loops with air, possibly reflecting ingestion of air during CPR. Electronically Signed   By: Garald Balding M.D.   On: 11/10/2017 23:29    Procedures Procedure Name: Intubation Date/Time: 2017-11-11 12:44 AM Performed by: Macarthur Critchley, MD Oxygen Delivery Method: Non-rebreather mask Preoxygenation: Pre-oxygenation with 100% oxygen Induction Type: Rapid sequence Ventilation: Two handed mask ventilation required Laryngoscope Size: Glidescope and 3 Grade View: Grade IV Tube size: 7.5 mm Number of attempts: 1 Placement Confirmation: ETT inserted through vocal cords under direct vision,  CO2 detector,  Positive ETCO2 and Breath sounds  checked- equal and bilateral Tube secured with: ETT holder      (including critical care time)  CRITICAL CARE Performed by: Gardiner Sleeper Total critical care time: 90 minutes Critical care time was exclusive of separately billable procedures and treating other patients. Critical care was necessary to treat or prevent imminent or life-threatening deterioration. Critical care was time spent  personally by me on the following activities: development of treatment plan with patient and/or surrogate as well as nursing, discussions with consultants, evaluation of patient's response to treatment, examination of patient, obtaining history from patient or surrogate, ordering and performing treatments and interventions, ordering and review of laboratory studies, ordering and review of radiographic studies, pulse oximetry and re-evaluation of patient's condition.   Angiocath insertion Performed by: Gardiner Sleeper  Consent: Verbal consent obtained. Risks and benefits: risks, benefits and alternatives were discussed Time out: Immediately prior to procedure a "time out" was called to verify the correct patient, procedure, equipment, support staff and site/side marked as required.  Preparation: Patient was prepped and draped in the usual sterile fashion.  Vein Location: R AC  Ultrasound Guided  Gauge:  20  Normal blood return and flush without difficulty Patient tolerance: Patient tolerated the procedure well with no immediate complications.     Medications Ordered in ED Medications  propofol (DIPRIVAN) 1000 MG/100ML infusion (has no administration in time range)  EPINEPHrine (ADRENALIN) 4 mg in dextrose 5 % 250 mL (0.016 mg/mL) infusion (1.3 mcg/min Intravenous New Bag/Given 10/16/2017 2324)  0.9 %  sodium chloride infusion (has no administration in time range)  iopamidol (ISOVUE-370) 76 % injection (has no administration in time range)  iopamidol (ISOVUE-370) 76 % injection 100 mL  (has no administration in time range)  sodium bicarbonate 1 mEq/mL injection (has no administration in time range)  fentaNYL (SUBLIMAZE) 100 MCG/2ML injection (has no administration in time range)  morphine 100mg  in NS 197mL (1mg /mL) infusion - premix (has no administration in time range)  propofol (DIPRIVAN) 1000 MG/100ML infusion (5 mcg/kg/min  86.2 kg Intravenous New Bag/Given 10/16/2017 2220)  fentaNYL (SUBLIMAZE) injection 50 mcg (50 mcg Intravenous Given 10/19/2017 2330)  norepinephrine (LEVOPHED) 4 mg in dextrose 5 % 250 mL (0.016 mg/mL) infusion (25 mcg/min Intravenous Rate/Dose Change 2017-11-06 0013)  sodium chloride 0.9 % bolus 1,000 mL (1,000 mLs Intravenous New Bag/Given 11/06/17 0000)  EPINEPHrine (ADRENALIN) 1 MG/10ML injection (1 Syringe Intravenous Given 11-06-17 0000)     Initial Impression / Assessment and Plan / ED Course  I have reviewed the triage vital signs and the nursing notes.  Pertinent labs & imaging results that were available during my care of the patient were reviewed by me and considered in my medical decision making (see chart for details).  Patient is a 82 year old male who came in for shortness of breath.  Patient had no pulse on arrival.  Patient was on a CPAP.  Patient EMS called out to facility, rehab facility for shortness of breath.  Patient was in rehab facility for humeral fracture.  Patient pulseless, tachycardic on arrival.  11:22 PM Patient coded on arrival.  Multiple rounds of epi given.  Patient intubated.  Difficult intubation given large amount of vomitus.  Patient has emptied out 2-1/2 large containers of vomit from OG tube  After intubation patient color change dramatically, pinked up.   Initially propofol hung given pressure in the 130s over 60.  Heart rate was 120s.  However after 10 minutes patient blood pressure decreased, hypotensive .  Levophed started.  Additional line placed.  Pressure increased to the 52D systolic.  Epinephrine drip  ordered.  X-rays chest x-ray shows ignificant bowel distention.  Patient's listed listed person, it was a sister-in-law.   I tried to explain patient's situation and she recommended that we call back in the morning.   Blood starting coming from ET tube.   Multiple liters  of feculant colored material from OG tube.    12:22 AM Despite being maxed out on Levophed, and 20 of epi, patient continued to lose his pulse.  After several hours of peri-coding, decision made that continuing to code would likely not serve patient.  Likely no neurologic recovery.  Will make patient comfortable.   12:41 AM Called the listed person again.  No answer but gave the phone number to call back.  Time of death 12:37.    Final Clinical Impressions(s) / ED Diagnoses   Final diagnoses:  Pain    ED Discharge Orders    None       Macarthur Critchley, MD 09-Nov-2017 0041    Macarthur Critchley, MD 09-Nov-2017 [phone]

## 2017-11-02 NOTE — ED Notes (Signed)
Dr Thomasene Lot informed of lactic acid 3.47 and venous blood gas results

## 2017-11-03 ENCOUNTER — Emergency Department (HOSPITAL_COMMUNITY): Payer: Medicare Other

## 2017-11-03 ENCOUNTER — Other Ambulatory Visit (HOSPITAL_COMMUNITY): Payer: BLUE CROSS/BLUE SHIELD

## 2017-11-03 LAB — CBC WITH DIFFERENTIAL/PLATELET
BASOS PCT: 0 %
Basophils Absolute: 0 10*3/uL (ref 0.0–0.1)
EOS PCT: 0 %
Eosinophils Absolute: 0 10*3/uL (ref 0.0–0.7)
HEMATOCRIT: 18.7 % — AB (ref 39.0–52.0)
Hemoglobin: 6 g/dL — CL (ref 13.0–17.0)
LYMPHS PCT: 9 %
Lymphs Abs: 0.7 10*3/uL (ref 0.7–4.0)
MCH: 30.5 pg (ref 26.0–34.0)
MCHC: 32.1 g/dL (ref 30.0–36.0)
MCV: 94.9 fL (ref 78.0–100.0)
Monocytes Absolute: 0.2 10*3/uL (ref 0.1–1.0)
Monocytes Relative: 3 %
NEUTROS ABS: 6.8 10*3/uL (ref 1.7–7.7)
NEUTROS PCT: 88 %
Platelets: 149 10*3/uL — ABNORMAL LOW (ref 150–400)
RBC: 1.97 MIL/uL — ABNORMAL LOW (ref 4.22–5.81)
RDW: 14.9 % (ref 11.5–15.5)
WBC: 7.7 10*3/uL (ref 4.0–10.5)

## 2017-11-03 LAB — PROCALCITONIN: PROCALCITONIN: 0.18 ng/mL

## 2017-11-03 LAB — PREPARE RBC (CROSSMATCH)

## 2017-11-03 MED ORDER — MORPHINE 100MG IN NS 100ML (1MG/ML) PREMIX INFUSION
10.0000 mg/h | INTRAVENOUS | Status: DC
  Filled 2017-11-03: qty 100

## 2017-11-03 MED ORDER — EPINEPHRINE PF 1 MG/10ML IJ SOSY
PREFILLED_SYRINGE | INTRAMUSCULAR | Status: AC | PRN
  Administered 2017-11-03: 1 via INTRAVENOUS

## 2017-11-03 MED ORDER — SODIUM BICARBONATE 8.4 % IV SOLN
INTRAVENOUS | Status: AC | PRN
  Administered 2017-11-02: 50 meq via INTRAVENOUS

## 2017-11-03 MED ORDER — SODIUM BICARBONATE 8.4 % IV SOLN
INTRAVENOUS | Status: AC
  Filled 2017-11-03: qty 50

## 2017-11-03 MED ORDER — CALCIUM CHLORIDE 10 % IV SOLN: INTRAVENOUS | Status: AC | PRN

## 2017-11-03 MED ORDER — EPINEPHRINE PF 1 MG/10ML IJ SOSY
PREFILLED_SYRINGE | INTRAMUSCULAR | Status: AC | PRN
  Administered 2017-11-02: 4 mg via INTRAVENOUS

## 2017-11-03 MED ORDER — FENTANYL CITRATE (PF) 100 MCG/2ML IJ SOLN
INTRAMUSCULAR | Status: AC
  Filled 2017-11-03: qty 2

## 2017-11-03 MED ORDER — IOPAMIDOL (ISOVUE-370) INJECTION 76%: 100.0000 mL | Freq: Once | INTRAVENOUS | Status: DC | PRN

## 2017-11-03 MED ORDER — EPINEPHRINE PF 1 MG/10ML IJ SOSY
PREFILLED_SYRINGE | INTRAMUSCULAR | Status: AC | PRN
  Administered 2017-11-02 (×2): 1 mg via INTRAVENOUS

## 2017-11-03 MED FILL — Medication: Qty: 1 | Status: AC

## 2017-11-12 NOTE — Progress Notes (Signed)
Pt. Extubated by RT as per MD order.

## 2017-11-12 NOTE — ED Provider Notes (Signed)
12:50 AM  D/w Dr. Inda Merlin with [facility] on call for patient's PCP Dr. Deforest Hoyles.  Appreciate his help.  We discussed the patient's case and time of death at 12:37 AM.  They will complete death certificate.  Message has been left with patient's sister-in-law.   Di Jasmer, Delice Bison, DO 11/17/17 [phone]

## 2017-11-12 NOTE — ED Notes (Signed)
Dr. Inda Merlin on call for [facility]

## 2017-11-12 NOTE — ED Notes (Addendum)
Pulse check - central pulses weak and thready. Compressions d/c.

## 2017-11-12 NOTE — ED Notes (Signed)
TOD 0037

## 2017-11-12 DEATH — deceased

## 2018-07-14 IMAGING — CR DG HUMERUS 2V *L*
3 series · 3 of 3 positions shown · non-contrast
Comparison: None.

CLINICAL DATA: Fall.  Humerus pain.

EXAM:
LEFT HUMERUS - 2+ VIEW

[x humerus ap left]
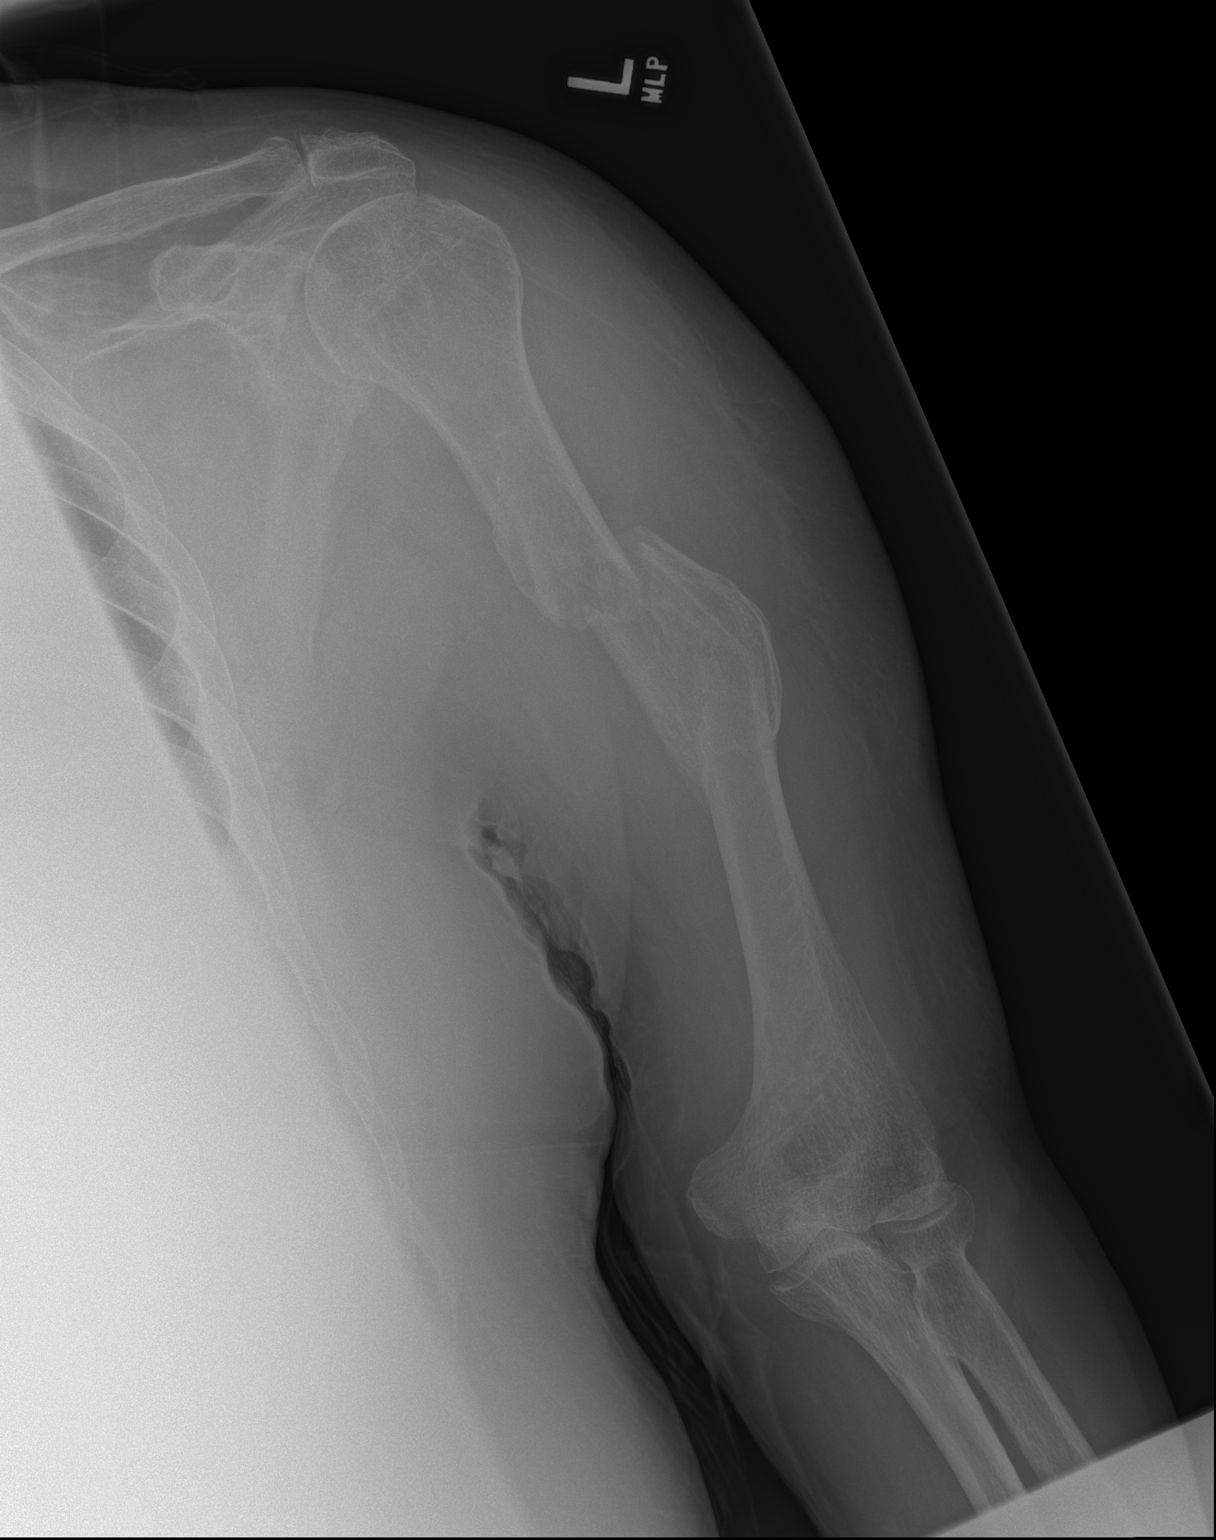

[x humerus lat left (1 of 2)]
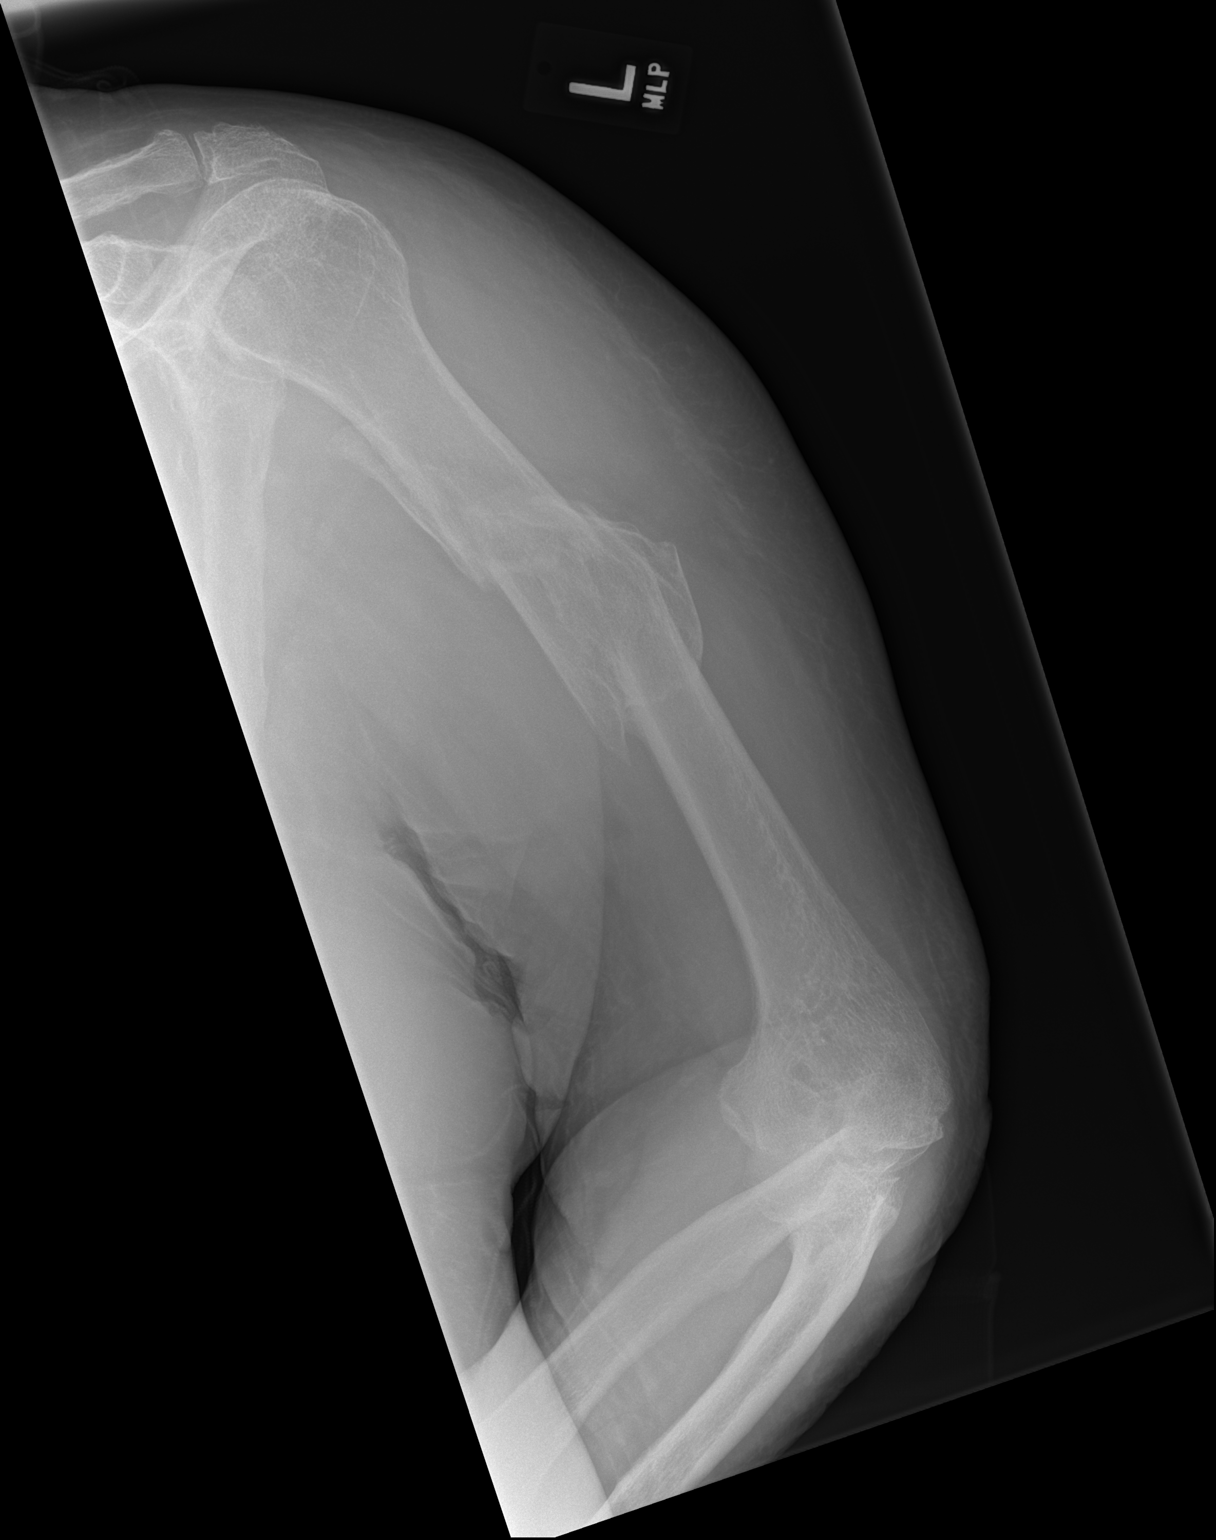

[x humerus lat left (2 of 2)]
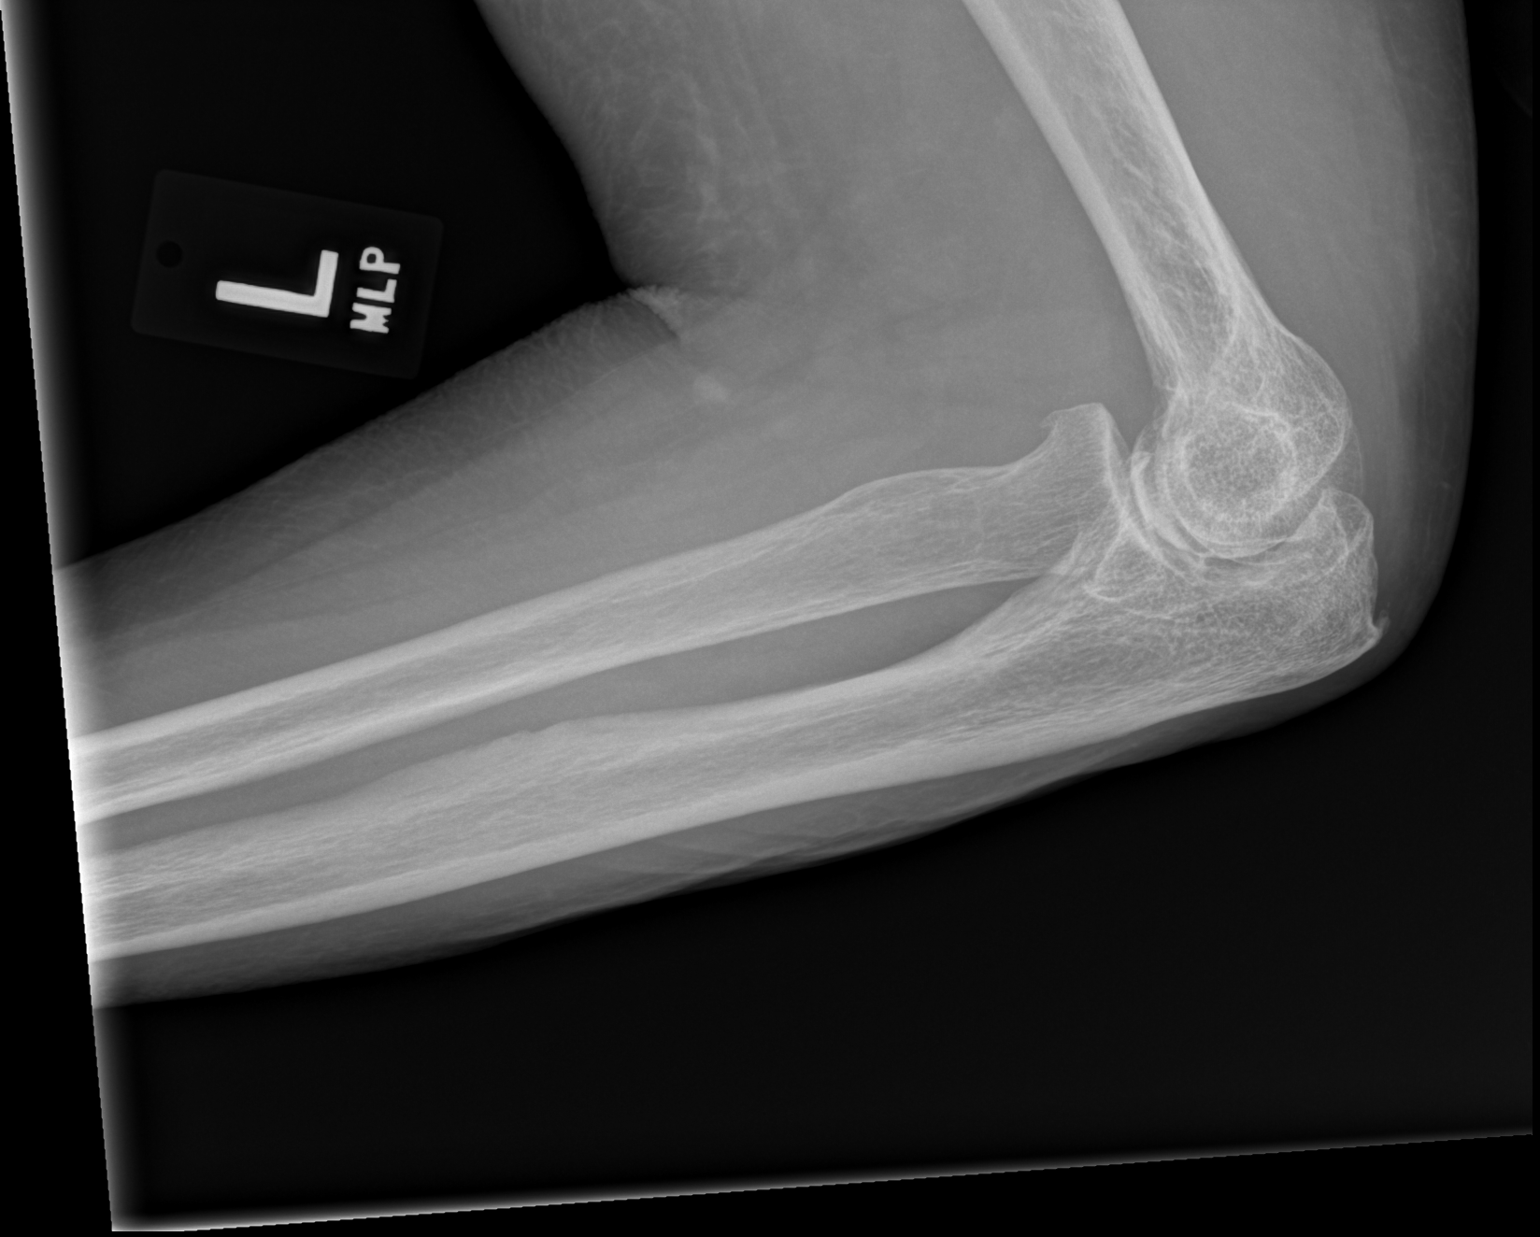

[3 of 3 positions shown; findings below may reference images not displayed]

FINDINGS: Chronic fracture deformity involving the mid shaft of the left
humerus is identified. There has been interval callus formation when
compared with 12/05/2013. The distal fracture lines are indistinct
compatible with healing. Along the proximal portion of the prior
fracture site there is lateral displacement of the distal fracture
fragments by [DATE] shaft's width. Here, superimposed acute fracture
cannot be excluded.
IMPRESSION: 1. Cannot exclude acute on chronic fracture deformity involving the
mid shaft of the left humerus. If there is high clinical suspicion
for acute fracture consider CT through this area.
